# Patient Record
Sex: Female | Born: 1990 | Race: White | Hispanic: No | Marital: Single | Smoking: Light tobacco smoker
Health system: Southern US, Community
[De-identification: ages and names within clinical notes are randomized; demographics above are authoritative.]

## PROBLEM LIST (undated history)

## (undated) DIAGNOSIS — R112 Nausea with vomiting, unspecified: Secondary | ICD-10-CM

## (undated) DIAGNOSIS — Z9889 Other specified postprocedural states: Secondary | ICD-10-CM

## (undated) DIAGNOSIS — Z9289 Personal history of other medical treatment: Secondary | ICD-10-CM

## (undated) DIAGNOSIS — D649 Anemia, unspecified: Secondary | ICD-10-CM

## (undated) HISTORY — PX: ADENOIDECTOMY: SUR15

---
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
## 2011-02-11 NOTE — H&P (Signed)
[name], [name]               ACCOUNT NO.:  [id number]   MEDICAL RECORD NO.:  [id number]          PATIENT TYPE:  INP   LOCATION:  0105                          FACILITY:  [facility]   PHYSICIAN:  Lalla Brothers, MDDATE OF BIRTH:  1990/11/04   DATE OF ADMISSION:  04/27/2007  DATE OF DISCHARGE:                       PSYCHIATRIC ADMISSION ASSESSMENT   IDENTIFICATION:  This 20 year old female, who will enter the 11th grade  this fall at [employer], is admitted emergently voluntarily from  crisis presentation to access and intake at [facility]  with stepmother for inpatient stabilization and treatment of suicide  risk and depression.  The patient had written a suicide note that she  had attempted to overdose with alcohol, methamphetamine, and pills and  then cut herself on the thighs with the intent to die.  The patient is  feeling progressively distressed over separation from her lesbian  girlfriend of 1-2 years who happens to also be the sister of her  stepmother.  Father and likely stepmother have been discouraging this  relationship but the family is significantly ambivalent developmentally  at all levels past and present.  The patient is confused and wants to  die, hearing a single female voice telling her to cut herself.   HISTORY OF PRESENT ILLNESS:  The patient has had little mental health  treatment over time despite her significant trauma and losses as well as  her mental health symptoms.  She is currently taking Symbyax 6/50 mg as  1 every bedtime from Dr. Deatra James at [facility],  [phone].  The patient finds limited relief initially from the  medication which has not been sustained.  The medication does make her  significantly drowsy.  Her mood is not consistently improved and she is  still feeling depressed.  The medication does make her drowsy at night  but she is sluggish in the day and getting little done.  The patient has  been  using various drugs since age 32, feeling entitled to do so as  father is addicted to alcohol according to the patient and mother to  cocaine among other drugs.  The patient resides with father and  stepmother as biological mother abandoned the patient to continue her  drug use.  The patient therefore finds herself living in the same style  as parents whose lifestyles left the patient lost and relatively  traumatized.  The patient does not talk about her problems or talk out  particular solutions.  She therefore does not contract for safety or  accept the help of others.  She does call her stepmother mom  concluding that stepmother has been her only mom and possibly the only  consistent parental resource in her life.  Father indicates that he is  taking Xanax and wondering if that is making him gain weight though the  patient states that he continues to drink.  The patient's suicide note  seems to express that she is stressed being separated from her lesbian  partner who is the sister of the patient's stepmother.  The patient is  aware that she is  expected to separate from this relationship and is  also aware that the lesbian girlfriend is disapproving of the patient's  substance abuse, especially for alcohol.  The patient indicates that she  gets into fights easily.  She expresses little interest in change at the  time of admission from intake and crisis.  She just wants to feel better  about what she is doing.  However, by the morning after admission, the  patient indicates that she is despondent and in despair and tired of  feeling down and cutting herself.  However, it takes awhile for the  patient to either realize such or to become willing to discuss such.  The patient is receiving no other active treatment.  She smokes one pack  per day of cigarettes.  She has used alcohol since age 22 as well as  opiates and benzodiazepines such as Xanax and Klonopin.  She has used  crystal  methamphetamine and cannabis since age 38.  The patient does not  acknowledge any benefit from her substance abuse but does acknowledge  that she feels entitled to do so since her parents did the same.  The  patient does not describe dissociation.  She does not describe other  specific habits.  However, she is obviously somewhat overweight and has  impulse control difficulties.  She acknowledges that she had a lot of  difficulty in 2002, being frequently accident prone.  However, she does  not identify specifics in that regard.  She is significantly defiant and  does what she wants in most cases including getting in fights easily.   PAST MEDICAL HISTORY:  The patient was under the care of Celso Amy,  [place] at [facility] but now sees Dr. Deatra James.  The patient has lacerations and scars on both anterior thighs from self-  cutting with her self-cutting now reaching the suicidal proportion.  She  indicates heavy drinking, methamphetamine and other pills in the last  few days as though attempting to overdose and kill herself.  In 2002,  the patient had multiple injuries.  She was in [facility]  Emergency Department for a CT scan of the head in June of 2002, having a  cerebral concussion.  She had a Salter II fracture of the right distal  radius in August of 2002.  The patient was in the emergency department  after taking sleeping pills in October of 2002.  She had chicken pox in  1998.  She had an adenoidectomy in 1994 or 1995.  She states she has had  ventilation tubes for recurrent ear problems that contribute to  diminished hearing, particularly in the right ear.  She states she has  had a long history of impairment of visual acuity but the family has not  been able to afford, economically, eyeglasses or contacts for her.  Her  last menses was two weeks ago and she denies any chance of pregnancy  though she has now broken off her lesbian relationship with  stepmother's  sister.  The patient was last to the dentist two weeks ago.  She has no  medication allergies.  She has had no definite seizure or syncope.  She  has had no heart murmur or arrhythmia.   REVIEW OF SYSTEMS:  The patient denies difficulty with gait, gaze or  continence.  She denies exposure to communicable disease or toxins.  She  denies rash, jaundice or purpura.  She has no headache or sensory loss  currently.  There is no memory loss or coordination deficit.  There is  no cough, congestion, dyspnea, wheeze or tachypnea.  There is no chest  pain, palpitations, or presyncope.  There is no abdominal pain, nausea,  vomiting or diarrhea.  There is no dysuria or arthralgia.   IMMUNIZATIONS:  Up-to-date.   FAMILY HISTORY:  The patient lives with father and stepmother.  Father  has substance abuse with alcohol and takes Xanax regularly.  Father asks  if Xanax causes him to gain weight as he has been unable to lose but he  does not consider that alcohol may cause weight gain.  Biological mother  had crack addiction possibly among other drugs and chose drugs over the  family abandoning the family.  Stepmother has been the patient's mom.  There is extensive family history of substance abuse.   SOCIAL AND DEVELOPMENTAL HISTORY:  The patient has been dating the  sister's stepmother in a lesbian relationship for 1-2 years.  The  patient indicates that she attends the 11th grade this fall at [employer] which she states is on [employer] and she is Economist.  The patient denies any court charges at this time.  She uses  alcohol, cannabis, opiates, benzodiazepines like Xanax and Klonopin, and  methamphetamines starting at various ages between 70 and 23.  She does  not acknowledge other definite sexual activity other than her lesbian  relationship.   ASSETS:  The patient is intelligent.   MENTAL STATUS EXAM:  The patient's height is 62-1/2 inches and weight  is  200.5 pounds.  Blood pressure is 125/83 with heart rate of 99 (sitting)  and 129/85 with heart rate of 106 (standing).  She is right more than  left-handed but has some mixed cerebral dominance.  She is alert and  oriented with speech intact.  Cranial nerves 2-12 are intact.  Muscle  strengths and tone are normal.  AMRs are 0/0.  There are no pathologic  reflexes or soft neurologic findings.  There are no abnormal involuntary  movements.  Gait and gaze are intact.  The patient is outwardly sincere  about needing help with her depression while describing inward  entitlement to substance abuse and depressing lifestyle.  Object  relations confusion and identity diffusion and conflict are evident.  She has moderate to severe dysphoria and is insecure about her anger.  She has no florid psychosis or mania.  She reports a single female voice  as an auditory hallucination or illusion telling her to cut herself.  She seems to correlate this most with her father.  However, she does not  offer direct answers to such formulations and questions.  She does not  acknowledge post-traumatic flashbacks but has significant object loss  from mother.  She has suicidal ideation with a plan to cut herself to  die as her overdose with drugs and alcohol did not work.  She is not  homicidal but she does get in fights easily and can be assaultive.   IMPRESSION:  AXIS I:  Major depression, single episode, moderate to  severe.  Oppositional defiant disorder.  Psychoactive substance abuse  not otherwise specified.  Identity disorder with passive-aggressive  features.  Other interpersonal problem.  Parent-child problem.  Other  specified family circumstances.  Noncompliance with treatment.  AXIS II:  Diagnosis deferred.  AXIS III:  Lacerations both thighs, overweight, hearing and visual  impairment, cigarette smoking.  AXIS IV:  Stressors:  Family--severe to  extreme, acute and chronic;  phase of life--severe,  acute and chronic; peer relations--extreme, acute  and chronic.  AXIS V:  GAF on admission 32; highest in last year estimated at 60.   PLAN:  The patient is admitted for inpatient adolescent psychiatric and  multidisciplinary multimodal behavioral health treatment in a team-based  program at a locked psychiatric unit.  Will change her Cymbalta which  was held the first night of admission to Prozac 40 mg nightly and  Topamax 100 mg nightly.  The patient and father were educated on the  indications, side effects, risks and proper use of the medications.  Carbamazepine would be another alternative to Topamax should economics  be more important.  Cognitive behavioral therapy, anger management,  interpersonal therapy, identity differentiation and consolidation,  object relations therapy, individuation separation, grief and loss,  social and communication skill training, problem-solving and coping  skill training, and substance abuse intervention can be undertaken.   ESTIMATED LENGTH OF STAY:  Seven days with target symptoms for discharge  being stabilization of suicide risk and mood, stabilization of out-of-  control substance use and dangerous, disruptive behavior and  generalization of the capacity for safe, effective participation in  outpatient treatment.      Lalla Brothers, MD  Electronically Signed     GEJ/MEDQ  D:  04/28/2007  T:  04/29/2007  Job:  [id number]

## 2011-02-11 NOTE — H&P (Signed)
[name], [name]               ACCOUNT NO.:  [id number]   MEDICAL RECORD NO.:  [id number]          PATIENT TYPE:  INP   LOCATION:  0103                          FACILITY:  [facility]   PHYSICIAN:  Lalla Brothers, MDDATE OF BIRTH:  [birth year]   DATE OF ADMISSION:  05/20/2007  DATE OF DISCHARGE:                       PSYCHIATRIC ADMISSION ASSESSMENT   IDENTIFICATION:  This 100[age]1/20-year-old female, 11th grade student at the  [employer], is admitted emergently voluntarily from  crisis presentation to access and intake with father and stepmother for  inpatient stabilization and treatment of suicide plan to cut her wrist  or jump into traffic.  The patient reports being hopeless and depressed  again, unable to tolerate failure and a current relationship and having  an F in pre-nursing class at school.  The patient states only cutting  helps her pain and sense of loss and has multiple wounds on both  anterior thighs though she has not relapsed into substance abuse or as  much rule breaking.   HISTORY OF PRESENT ILLNESS:  The patient acknowledges that her greatest  stressor currently is the breakup in relationship with her lesbian  girlfriend.  The patient seems to indicate that this is her stepsister  again, with the patient being forbidden to date this stepsister at the  time of her last hospitalization at the [facility] April 27, 2007.  Father indicates to the patient that she keeps establishing  herself in a web of self-defeating relationships.  The patient was  becoming overwhelmed in the course of this relationship preceding her  last admission and now has apparently relapsed in that relationship in  some way.  The patient was considered to have a single episode of major  depression last hospitalization.  She had a longer standing pattern of  substance abuse with alcohol and cannabis dating back to age 50, if not  earlier.  However, she had an  overdose of sleeping pills in 2002 which  would be around age 44.  The patient reports two previous suicide  attempts apparently indicating the overdose and one other.  She was  admitted April 27, 2007 after writing a suicide note that she planned to  overdose again.  She is currently considering jumping into traffic or  cutting her wrist to die.  The patient has indicated that she does not  tolerate failure.  She suggests she is talking to her parents more since  her last hospitalization and has not relapsed in alcohol or cannabis.  The patient's grades were declining at the time of her last  hospitalization after making all A's in the past.  She is no longer  hearing the single female voice auditory hallucination telling her to cut  herself.  The patient was considered during her last hospitalization to  have early stages of alcohol and cannabis dependence, though also having  some methamphetamine, Xanax and Klonopin abuse episodically in the past.  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  At the same time, she has had  behavioral and substance abuse patterns that would likely interfere with  achieving these goals.  The patient has not acknowledged identification  with biological mother but her pattern of substance abuse and  relationship difficulties would certainly raise that concern.  The  patient indicates at the time of admission that she has gained weight  since last hospitalization though she is documented to have lost about  4.86-5.5 kg.  The patient has been on Topamax 200 mg nightly and  fluoxetine 40 mg nightly since her last hospitalization, being crossed  over from Symbyax 6/50 mg nightly that had been started by Dr. Wynelle Link prior  to the last hospitalization though dates were not fully clarified though  she was experiencing sedation from the  Symbyax.  The patient currently  states that the Topamax may be causing some decreased memory and some  tremor.  However, the differential for such symptoms could also include  substance use, undernutrition, or depressive agitation and confusion.  Patient is continuing cigarettes approximately half pack per day.  The  patient does not acknowledge other central nervous system insult though  in 2002 she had a cerebral concussion requiring CT scan of the head in  June of 2002, right wrist fracture, and the overdose with sleeping  pills.  The patient is not acknowledging other psychic trauma at this  time.  She reportedly does not have contact with biological mother.  She  is most supported by stepmother and apparently the family was not firm  with expecting relationship between the patient and stepsister to become  a nonromantic one, limited to just being stepsisters.  However, the  family seems to again expect such.  The patient and family did not  follow through with aftercare psychotherapy following the last hospital  discharge May 03, 2007.  They were scheduled to see Marni Griffon at  [facility] the [facility] May 11, 2007 but apparently did not  comply.  The patient was also to abstain from self-cutting but has not  done so, rather relapsing into progressive cutting.   PAST MEDICAL HISTORY:  The patient is under the primary care of Dr.  Evert Kohl at [facility].  The patient has a history  of chickenpox at age 30.  She had adenoidectomy apparently in 1996.  She  had ventilation tubes for diminished hearing, particularly in the right  ear.  She needed eyeglasses but the family has not been able to purchase  such financially.  The patient had a right wrist fracture in 2002 as  well as an overdose on sleeping pills.  In June of 2002, she had a  cerebral concussion and CT scan of the head was negative.  At the time  of the last admission, the patient was  using Afrin nose spray and having  nose bleeds.  During her last hospitalization, she was discontinued from  Afrin and was treated with Bactroban nasal ointment with resolution of  nosebleeds and she has apparently not restarted Afrin.  During her last  hospitalization, her total cholesterol was elevated at 122 with upper  limit of normal 169 and LDL cholesterol was elevated at 147 with upper  limit of normal 109.  HDL cholesterol was normal at that time.  The  patient has complained of some right low back pain recently which  stepmother suspects might be a urinary infection.  The patient is on  Topamax which can predispose  to urolithiasis but she is not definitely  having colic although she rates her back pain high as in 8/10.  However,  the patient smiles as she is doing so, suggesting an incongruence as  though overstating her symptoms.  The patient denies that her acne is  any worse from fluoxetine.  Last dental exam was July of 2008 before her  last hospitalization.  At the time of current admission, she is taking  Topamax 200 mg nightly and fluoxetine 40 mg nightly and does not  acknowledge using Bactroban currently.  She has no medication allergies.  She does not acknowledge any history of seizure or syncope.  She has had  no heart murmur or arrhythmia.   REVIEW OF SYSTEMS:  The patient denies difficulty with gait, gaze or  continence.  She denies exposure to communicable disease or toxin  otherwise.  She denies rash, jaundice or purpura currently.  She has no  headache or sensory loss.  However, she does report some clouding of her  memory and some tremor without any loss of coordination.  She has no  cough, congestion, chest pain, palpitations, presyncope, dyspnea or  wheeze.  There is no abdominal pain, nausea, vomiting or diarrhea.  There is no dysuria or arthralgia though she does have the right low  back pain.   IMMUNIZATIONS:  Up-to-date.   FAMILY HISTORY:  There is a  strong family history of substance abuse  though they are not more specific.  Father has had alcohol abuse and  currently takes Xanax for anxiety.  The patient's biological mother had  addiction to crack cocaine and antisocial behavior, abandoning the  family.  The patient has lived with stepmother for the last five years  and reports having sisters, ages 70 and 6.  The patient seems  relationally closest to stepmother though romantic with stepsister and  she reported an auditory hallucination that was masculine, not  definitely her father during her last hospitalization, telling her to  cut herself.   SOCIAL AND DEVELOPMENTAL HISTORY:  The patient is an 11th grade student  at the [employer] apparently a [employer] associated with  [employer].  She indicates she is currently failing pre-nursing.  Her grades have declined at the end of the last school year and she  usually had A's in the past prior to that.  She wants to be a Tree surgeon.  She has used alcohol and cannabis in the past, progressive since  age 26, and she was concluded to have early stages of alcohol and  cannabis dependence during her last admission.  She has not used alcohol  or cannabis since that last admission.  She has also used  methamphetamine, Xanax and Klonopin in the past.  The patient denies  legal charges.  She denies heterosexual intercourse but does acknowledge  a lesbian relationship with her current stepsister.  She does smoke  cigarettes apparently half-pack per day.   ASSETS:  The patient is intelligent.   MENTAL STATUS EXAM:  Height is 63 inches, up from 62.5 inches last  admission or 160 cm.  Weight is 87 kg, down from 92.5 kg at discharge  and 91.14 kg on admission, April 27, 2007 through May 03, 2007.  Blood  pressure is 106/72 with heart rate of 80 (sitting) and 121/76 with heart  rate of 92 (standing).  She is right more than left-handed but has mixed  cerebral dominance.   Cranial nerves 2-12 are intact.  Muscle strengths  and tone are normal.  There are no pathologic reflexes or soft  neurologic findings at this time.  There is no abnormal involuntary  movement though the patient describes some episodic tremor that she has  attributed to her Topamax.  The patient has cognitive dissonance and  psychomotor slowing.  Memory seems intact for recent and remote as well  as immediate content though she is reporting that her mind is clumsy and  slowed by self-report.  The patient is observed to be somewhat distant  and mechanical when being specifically questioned though otherwise she  has reflexive spontaneity and affect and thought.  The patient may be  gaining some further insight and access into the origin and course of  her identifications and social and emotional postures.  The patient  seems overwhelmed in that process currently.  She does not seem to have  resolved the loss of biological mother and the insults from the  biological mother.  Father describes a web of maladaptive connections in  her family relations.  The patient does not manifest significant anxiety  but rather cognitive dissonance almost approaching dissociation  initially.  She does not report hallucinations at this time such as an  auditory masculine voice telling her to cut herself as occurred at the  last hospitalization.  Risk-taking seems to be exacerbating again with  self-cutting and relational risk-taking being the first to decompensate.  She has no definite hallucinations at this time and no definite  delusions.  She has suicidal ideation and plan.  She seems to have habit  formation and impulse control difficulties.  She has suicide plan to cut  her wrist or jump into traffic to die, though she has been cutting both  anterior thighs again to dissipate strong negative emotion and to  establish control over suicide impulses.  She denies homicidal ideation  or assaultiveness.    IMPRESSION:  AXIS I:  Major depression, single episode, severe with  atypical features.  Oppositional defiant disorder.  Alcohol dependence,  early stage.  Cannabis dependence, early stage.  Other specified family  circumstances.  Parent-child problem.  Other interpersonal problem.  Noncompliance with psychotherapy.  AXIS II:  Diagnosis deferred.  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  AXIS IV:  Stressors:  Family--severe to extreme, acute and chronic;  school--mild to moderate, acute and chronic; phase of life--severe,  acute and chronic.  AXIS V:  GAF on admission 36; highest in last year estimated at 60.   PLAN:  The patient is admitted for inpatient adolescent psychiatric and  multidisciplinary multimodal behavioral health treatment in a team-based  programmatic locked psychiatric unit.  Will increase fluoxetine to 60 mg  nightly and decrease Topamax to 100 mg nightly initially while awaiting  laboratory metabolic screens.  Multivitamin and Neosporin topically will  be planned along with behavioral assessment of eating competency.  Drug  abuse survey is pending.  Urinalysis is pending relative to low back  pain and urine culture will be obtained as well.  Cognitive behavioral  therapy, anger management, interpersonal therapy, identity  consolidation, substance abuse relapse prevention, habit reversal,  family therapy, social and communication skill training, and problem-  solving and coping skill training can be undertaken  as therapies.   ESTIMATED LENGTH OF STAY:  Five to seven days with target symptoms for  discharge being stabilization of suicide risk and self-injury,  stabilization of mood and  dangerous, disruptive behavior, and  generalization of the capacity for safe, effective participation in  outpatient treatment.      Lalla Brothers, MD  Electronically Signed     GEJ/MEDQ  D:  05/20/2007  T:  05/21/2007  Job:  [id number]

## 2011-02-14 NOTE — Discharge Summary (Signed)
[name], [name]               ACCOUNT NO.:  [id number]   MEDICAL RECORD NO.:  [id number]          PATIENT TYPE:  INP   LOCATION:  0105                          FACILITY:  [facility]   PHYSICIAN:  [name], MDDATE OF BIRTH:  11-Feb-1991   DATE OF ADMISSION:  04/27/2007  DATE OF DISCHARGE:  05/03/2007                               DISCHARGE SUMMARY   IDENTIFICATION:  20-year-old female entering Lisa 11th grade this  fall at [name], associated with [name], was  admitted emergently voluntarily when brought by parents to [name] at [name] for suicide note  documenting suicide attempt by overdose, as well as self-cutting,  continued suicidality.  Lisa patient is most distressed by separation  from her girlfriend of 2 years duration, who is Lisa sister of Lisa  patient's stepmother, and now forbidden to be Lisa patient's girlfriend.  Lisa patient reports a single female voice, auditory hallucination telling  her to cut herself, and states she is confused but wants to die.  For  full details, please see Lisa typed admission assessment.   SYNOPSIS OF PRESENT ILLNESS:  Lisa patient has started Symbyax 6/50 every  bedtime from Dr. Deatra Mcmillan at [name], which  helps her sleep, though she has not yet improved her mood and behavior.  Lisa patient described significant substance abuse including with alcohol  since age 20, cannabis since age 20, opiates and benzodiazepines since  age 20, and methamphetamine since age 20, though her urine drug screen  on admission is negative.  She has crisscross razor lacerations on both  anterior thighs, though Lisa patient states that cutting does not seem to  help anymore.  Her suicide note documents wanting to end it all.  Lisa  patient documents significant substance abuse in Lisa family, with mother  having crack addiction among other drugs and abandoning Lisa family  for  her drugs.  Lisa patient considers stepmother to be her mom, though she  is concerned about father's substance abuse with alcohol, and father  also takes Xanax.  Lisa patient wants to be a Leisure centre manager.  Stepmother  has lived with Lisa patient for 5 years.  Lisa patient has no relation  with biological mother now.  Lisa patient has sisters ages 25 and 16.  She had been a [employer], but her grades have dropped.   INITIAL MENTAL STATUS EXAM:  Lisa patient maintains inward entitlement to  substance abuse and depressing lifestyle, while outwardly manifesting  sincerity about treatment need.  However, she seems ambivalent about  investment in treatment.  She is right more than left-handed, but has  mixed cerebral dominance.  She manifests no or organicity.  She has  moderate to severe dysphoria with Lisa single auditory hallucination of a  female voice telling her to cut.  She has identity diffusion in object  relations confusion.  She has significant object loss relative to  mother, but denies post-traumatic flashbacks.  She fights easily but is  not homicidal.   LABORATORY FINDINGS:  CBC  on admission is normal with 2% basophils, with  upper limit of normal 1% on differential, with absolute basophil count  200, with upper limit of normal 100.  Total white count was normal at  9900, hemoglobin 14.8, MCV of 88 and platelet count 260,000.  Comprehensive metabolic panel on admission is normal with sodium 139,  potassium 3.6, fasting glucose 89, creatinine 0.71, calcium 9.9, albumin  4.1, AST 19 and ALT 15 with GGT 23.  Ten-hour fasting lipid panel  revealed total cholesterol elevated at 222 with upper limit of normal  169, LDL cholesterol 147 with upper limit of normal 109, but HDL  cholesterol normal at 44 mg/dL.  Ten-hour fasting triglyceride was 153  with 14-hour fasting normal being less than 150.  Hemoglobin A1c was  normal at 5.4% with reference range 4.6 to 6.1.  Free T4 was  normal at  1.06 and TSH at 2.245.  A final comprehensive metabolic panel on Lisa day  before discharge was normal with fasting glucose 93, creatinine 0.8,  and calcium 9.7.  Lisa patient's chloride before Topamax 200 mg nightly  was 102 with reference range 96-112, and on Lisa day before discharge on  Topamax, Lisa chloride was 107.  Before Topamax, her initial CO2 was 25  with reference range 19-32, and on Lisa day before discharge, CO2 was  normal at 26.  Urine probe for gonorrhea and Chlamydia trachomatis by  DNA amplification were both negative.  RPR was nonreactive.  Urine  pregnancy test was negative.  Urinalysis was normal except concentrated  specimen with specific gravity 1.035, pH 5.5, small amount of bilirubin.  Urine drug screen was negative with creatinine of 292 mg/dL, documenting  adequate specimen.   HOSPITAL COURSE AND TREATMENT:  Old record did document a normal CT of  Lisa head without contrast from June 2002 when Lisa patient hit her head  with syncope.  General medical exam by Lisa Guild, PA-C noted  adenoidectomy in 1994 and right wrist fracture at age 20.  Lacerations  were currently not infected and were treated with Neosporin, while  nosebleeds and potentially habitual use of Afrin were treated with  Bactroban nasal.  BMI was 36.1.  She has acne vulgaris.  She she is  never been sexually active, according to Lisa patient, but is homosexual.  Vital signs were normal throughout hospital stay with height on  admission being 62-1/2 inches and weight was 91.14 kg.  Discharge weight  was 92.5 kg.  Supine blood pressure initially was 122/86 with heart rate  of 84, and standing blood pressure 154/99 with heart rate of 99.  Diastolic blood pressure was elevated Lisa first 3 hospital days with  blood pressure otherwise normal.  On Lisa final 3 hospital days, Lisa  patient's blood pressures were normal including on Lisa day of discharge  Lisa supine blood pressure being 115/71 with heart  rate of 91 supine, and  143/67 with heart rate of 91 standing.  Lisa patient was discontinued  from Symbyax.  She had nutrition consult addressing her fast-food  fixation and excess, and ways to reduce cholesterol and contain weight.  Skin hygiene was stressed relative to acne, and Lisa patient's use of 1  pack per day of cigarettes prior to admission necessitated availability  of Nicoderm 21-mg patch, though Lisa patient used this infrequently.  She  would also decline her Bactroban nasal at times, but was consistent with  her Neosporin for her thighs.  Lisa patient was started on Topamax,  titrated up to 200 mg nightly, and fluoxetine 40 mg nightly, as Symbyax  was discontinued for elevated cholesterol and weight.  Lisa patient was  not troubled by auditory hallucinations during Lisa hospital stay.  Substance abuse assessment by Cleophas Dunker concluded alcohol dependence,  early stage, and cannabis dependence, early stage.  Family therapy was  most important including with biological father and stepmother.  Biological father visited frequently after Lisa first family therapy  session.  Stepmother was present for discharge.  Lisa patient was  apologetic in her letters to father and stepmother, and they discussed  communication and relations.  Lisa patient wanted to go to [employer]  immediately after discharge, despite nutrition consultation addressing  discontinuation of fast food.  Lisa patient, thereby, maintain some  oppositionality, but was more sincere and communicative with Lisa family.  She required no seclusion or restraint during hospital stay.   FINAL DIAGNOSES:  AXIS I:  1. Major depression, single episode, severe.  2. Oppositional defiant disorder.  3. Alcohol dependence, early stage.  4. Cannabis dependence, early stage.  5. Parent-child problem.  6. Other interpersonal problem.  7. Other specified family circumstances.  8. Noncompliance with treatment.  AXIS II:  Diagnosis  deferred.  AXIS III:  1. Self-inflicted lacerations, both thighs.  2. Overweight with elevated LDL cholesterol.  3. Acne.  4. Cigarette-smoking.  5. Hearing and visual impairment by history.  6. Afrin habituation with nosebleeds.  AXIS IV:  Stressors family severe to extreme, acute and chronic; phase  of life severe, acute and chronic; peer relations extreme, acute and  chronic.  AXIS V:  Global assessment of functioning on admission 32 with highest  in last year 60, and discharge global assessment of functioning was 53.   PLAN:  Lisa patient was discharged to stepmother in improved condition,  free of suicide and homicide ideation.  She follows Lisa weight and  cholesterol-controlled diet as per nutrition consultation April 29, 2007.  She has no restrictions on physical activity but is rather encouraged be  physically active.  Her Bactroban may be utilized for both thigh-  scarring minimization as well as another week, at least b.i.d., to both  nostrils for history of nosebleeds.  M[facility]illan and safety plans are  outlined if needed.  She is prescribed fluoxetine 40 mg every bedtime,  quantity #30 with 1 refill written.  She is also prescribed Topamax 200  mg every bedtime, quantity #30 with 1 refill written.  She is educated  on side effects, risks, and proper use of medication including FDA  guidelines and instructions.  She is having no side effects at Lisa time  of discharge, including no suicide-related side effects.  She will have  aftercare at [facility], having an appointment with  Marni Griffon May 11, 2007, at noon, and psychiatric followup can be  arranged from that intake, though she has in Lisa past seen Dr. Deatra Mcmillan also at [facility] at [name] for pharmacotherapy as  potential followup.      Lisa Brothers, MD  Electronically Signed     GEJ/MEDQ  D:  05/04/2007  T:  05/04/2007  Job:  [id number]

## 2011-02-14 NOTE — Discharge Summary (Signed)
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  The patient again  experiences a sense of failure in her relationship with a sister of her  stepmother that father wants to end.  The patient is cutting herself  again to help her pain and sense of loss and to contain suicide  impulses, having multiple wounds on both anterior thighs.  She has not  relapsed into substance abuse since last discharge and is less rule-  breaking.  The patient seems to seek some nurturing resolution of her  self-destructiveness at the [facility] almost as a  displacement of that forbidden relationship with stepmother's sister.  For full details, please see the typed admission assessment.   SYNOPSIS OF PRESENT ILLNESS:  Amazing was treated inpatient April 27, 2007  through May 03, 2007 for similar symptoms but did not follow through  with aftercare.  She did not attend her appointment with [facility]  [facility] the [place] for psychotherapy and dual diagnosis treatment.  At the  time of her last hospitalization, the patient may have overstated her  alcohol and cannabis use so that her substance abuse assessment at that  time concluded early stage alcohol and cannabis dependence.  The patient  has  some paradoxical identification with biological mother who has crack  addiction among other drugs and who abandoned the patient and family for  continuing drugs.  This patient now considers stepmother as mom though  she is concerned about father's substance use with alcohol.  Father also  takes Xanax for anxiety and dysphoria.  The patient had an overdose with  sleeping pills in 2002 around age 11 without apparently receiving other  mental health treatment and she reported a masculine auditory  hallucination at the time of her last admission instructing her to cut  herself but that is now resolved.  She has been known to abuse,  methamphetamine, Xanax and Klonopin episodically in the past as well.  She became overly sleepy and is overweight such that Symbyax 6/50 mg  nightly started before her last hospitalization was not the optimal  treatment and she was treated with Topamax 200 mg nightly as well as  fluoxetine 40 mg nightly.  However, she has had some diminished memory  functioning and some tremor on 200 mg of Topamax nightly, though without  other side effects.  She had a CT scan  of the head in June of 2002 for a  cerebral concussion that was negative.  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  She needs eyeglasses but the family has not been able to purchase  these financially.  She was habitually using Afrin nose spray with nose  bleeds during her last hospitalization and was treated with Bactroban  nasal ointment and such was resolved.  She currently has right low back  pain that stepmother suspects to be urinary infection but not  urolithiasis which the stepmother has experienced in the past.  The  patient has significant abdominal overweight stature.  The patient  smokes a half-pack per day of cigarettes.   INITIAL MENTAL STATUS EXAM:  The patient reports her mind to be clumsy  and slowed by self-report but she  is not objectively noted to have any  neurological abnormalities with neurological exam intact.  She was  somewhat distant and mechanical initially on presentation but quickly  engaged in treatment this time.  She has no auditory hallucinations at  this time.  She does have severe dysphoria.  She is somewhat habit  ridden with impulse control difficulties.  She has atypical depressive  features with overeating and easy anger.  She had suicide plan to cut  her wrist or jump into traffic.   LABORATORY FINDINGS:  During the patient's last hospitalization, fasting  glucose was normal at 89, sodium 139, potassium 3.6, calcium 9.9,  albumin 4.1, AST 19, ALT 15 and GGT 23 on April 28, 2007.  10-hour  fasting lipid panel revealed total cholesterol elevated at 222 with  upper limit of normal 169, LDL cholesterol elevated at 147 with upper  limit of normal 109 and HDL cholesterol normal at 44 with triglyceride  153.  Hemoglobin A1C was normal at 5.4% with reference range 4.6-6.1.  Free T4 was normal at 1.06 and TSH at 2.245.  Her final chloride on  May 02, 2007 on 200 mg of Topamax nightly was 107, having been 102  before Topamax was started with reference range 96-112.  Her CO2 on  May 02, 2007 was 26, having been 25 prior to Topamax with reference  range 19-32.  Urine drug screen was negative at the time of last  hospitalization.  During this hospitalization, basic metabolic panel was  normal except potassium 3.4 with lower limit of normal 3.5.  Sodium was  normal at 137, random glucose 86, chloride 107, CO2 24, creatinine 0.8,  calcium 9.1.  Hepatic function panel was normal with albumin 4, AST 20,  ALT 17, GGT 27, and total bilirubin 0.5.  CBC was normal except white  count slightly elevated at 10,300 with upper limit of normal 10,000 and  platelet count slightly elevated at 329,000 with upper limit of normal  325,000.  Urine pregnancy test was negative.  Urinalysis was normal  except for a  trace of ketones, trace of occult blood, small amount of  bilirubin and specific gravity upper limit of normal at 1.030 with many  epithelial and some amorphous urate crystals on microscopic exam.  Urine  culture was 15,000 colonies per milliliter of multiple bacterial  morphotypes with no predominant pathogens suggesting poor clean catch.  Urine drug screen was negative with creatinine of 304 mg/dL.   HOSPITAL COURSE AND TREATMENT:  General medical exam by Mallie Darting PA-  C noted frequent stomachaches and the right low back pain.  The patient  reported fainting after the breakup with her girlfriend but stated she  stopped eating as the cause.  She had old and new self-inflicted  lacerations and has obesity.  The patient received several doses of  acetaminophen 1000 mg during the hospital stay but required no other  analgesics and had no objective interference in the treatment program by  low back pain.  The abdominal obesity may well be contributing to the  source of the right low back pain and no other pathological abnormality  was found though the patient may wish follow-up with Dr. Deatra James in  this regard.  The patient was afebrile throughout the hospital stay.  She had education on a weight and cholesterol control diet by nutrition  April 29, 2007 during her last hospitalization.  Wound care was provided  with Neosporin and she had no further self-injury during the hospital  stay.  Noncompliance with aftercare last admission was addressed with  father and stepmother as well as the patient including in the final  family therapy session on the day of discharge.  Father indicated during  this family session that grandparents had called DSS in the past and  threatened to do so again, considering the father neglectful.  This  allowed father to address compliance necessary for the patient's  aftercare.  Father feels the patient is attention-seeking though the  patient talks normally  with stepmother.  The patient shuts down when  angry, especially with father.  Family has established interventions to  prevent the patient's contact with the stepmother's sister in the  future.  The patient herself concluded she wants to stay away from the  stepmother's sister of her own accord now and acknowledges that family  prohibition did not work.  The patient became motivated to return to  school and to be involved with sports or get a job.  The patient's  Topamax was reduced to 100 mg every bedtime and her tremor and memory  difficulties resolved.  Her fluoxetine was advanced to 60 mg every  bedtime and she tolerated this well.  She required no seclusion or  restraint during the hospital stay.  Her self-injurious behavior and her  suicidal ideation resolved.  She required no seclusion or restraint  during the hospital stay.   FINAL DIAGNOSES:  AXIS I:  Major depression, single episode, severe with  atypical features.  Oppositional defiant disorder.  Alcohol abuse.  Cannabis abuse.  Other specified family circumstances.  Other  interpersonal problem.  Parent-child problem.  Noncompliance with  treatment.  AXIS II:  Diagnosis deferred.  AXIS III:  Right low back pain, likely mechanical, possibility  associated with abdominal obesity, elevated total and LDL cholesterol,  self-inflicted lacerations both anterior thighs, diminished hearing  chronically right ear with history of ventilation tubes, needs  eyeglasses for impaired visual acuity, history of fainting after breakup  with girlfriend when not eating.  AXIS IV:  Stressors:  Family--severe to extreme, acute and chronic;  school--mild to moderate, acute and chronic; phase of life--severe,  acute and chronic; peer relations--severe, acute and chronic.  AXIS V:  GAF on admission 36; highest in last year estimated at 60;  discharge GAF 55.   CONDITION ON DISCHARGE:  The patient's weight had been 92.5 kg on May 01, 2007  during her last hospitalization.  Her admission weight on May 20, 2007 this time was 87 kg with subsequent weight May 22, 2007 of  88.75 kg.  Her height was 160 cm.  Her blood pressure at the time of  discharge is 123/76 with heart rate of 77 (supine) and 135/89 with heart  rate of 98 (standing).   ACTIVITY/DIET:  The patient is discharged on the weight and cholesterol  control diet educated by nutrition April 29, 2007.  She has no  restrictions on physical activity.  She is to abstain from any self-  cutting and can use Neosporin for wound and scar care.  Crisis and  safety plans are outlined if needed.  She requires no pain management at  this time though she has taken several doses of acetaminophen during her  hospital stay.  She is prescribed the following medication at discharge.   DISCHARGE MEDICATIONS:  1. Fluoxetine 20 mg capsule, to take 3 every bedtime; quantity #90      with one refill prescribed.  2. Topamax 100 mg every bedtime; quantity #30 with one refill      prescribed.   FOLLOWUP:  The patient will have ongoing medical follow-up and care with  Dr. Deatra James as scheduled by the family.  The patient will have  outpatient mental health care intake for dual diagnosis care at [facility] [place] with Marni Griffon at [phone], extension [phone]  with appointment scheduled on June 01, 2007 at 12 noon.  Psychiatric  appointment can be scheduled from that intake with Marni Griffon.      Lalla Brothers, MD  Electronically Signed     GEJ/MEDQ  D:  05/26/2007  T:  05/27/2007  Job:  [id number]   cc:   Marni Griffon  [facility]  [address]  [place], [place]  fax [phone]   Dr. Ermel[facility]  [address] [place], [place] [address]

## 2011-07-11 LAB — URINALYSIS, ROUTINE W REFLEX MICROSCOPIC
Glucose, UA: NEGATIVE
Leukocytes, UA: NEGATIVE
Nitrite: NEGATIVE
Protein, ur: NEGATIVE
Specific Gravity, Urine: 1.03
Urobilinogen, UA: 1
pH: 6

## 2011-07-11 LAB — URINE CULTURE
Colony Count: 15000
Special Requests: NEGATIVE

## 2011-07-11 LAB — CBC
HCT: 39.9
Hemoglobin: 13.7
MCHC: 34.3
MCV: 89.3
RBC: 4.47
WBC: 10.3 — ABNORMAL HIGH

## 2011-07-11 LAB — HEPATIC FUNCTION PANEL
ALT: 17
AST: 20
Alkaline Phosphatase: 62
Bilirubin, Direct: <0.1
Total Bilirubin: 0.5

## 2011-07-11 LAB — URINE MICROSCOPIC-ADD ON

## 2011-07-11 LAB — PREGNANCY, URINE: Preg Test, Ur: NEGATIVE

## 2011-07-11 LAB — BASIC METABOLIC PANEL
CO2: 24
Chloride: 107
Potassium: 3.4 — ABNORMAL LOW

## 2011-07-11 LAB — DRUGS OF ABUSE SCREEN W/O ALC, ROUTINE URINE
Amphetamine Screen, Ur: NEGATIVE
Barbiturate Quant, Ur: NEGATIVE
Benzodiazepines.: NEGATIVE
Cocaine Metabolites: NEGATIVE
Creatinine,U: 303.9
Marijuana Metabolite: NEGATIVE
Methadone: NEGATIVE
Opiate Screen, Urine: NEGATIVE
Phencyclidine (PCP): NEGATIVE
Propoxyphene: NEGATIVE

## 2011-07-14 LAB — LIPID PANEL
HDL: 44
Total CHOL/HDL Ratio: 5

## 2011-07-14 LAB — COMPREHENSIVE METABOLIC PANEL
ALT: 15
AST: 19
Alkaline Phosphatase: 55
BUN: 12
CO2: 25
Chloride: 102
Creatinine, Ser: 0.8
Creatinine, Ser: 0.71
Glucose, Bld: 93
Potassium: 3.9
Sodium: 139
Total Bilirubin: 1
Total Protein: 6.7

## 2011-07-14 LAB — T4, FREE: Free T4: 1.06

## 2011-07-14 LAB — TSH: TSH: 2.245

## 2011-07-14 LAB — URINALYSIS, ROUTINE W REFLEX MICROSCOPIC
Ketones, ur: NEGATIVE
Nitrite: NEGATIVE
pH: 5.5

## 2011-07-14 LAB — CBC
Hemoglobin: 14.8
MCV: 88.2
RBC: 4.9
WBC: 9.9

## 2011-07-14 LAB — DRUGS OF ABUSE SCREEN W/O ALC, ROUTINE URINE
Cocaine Metabolites: NEGATIVE
Phencyclidine (PCP): NEGATIVE
Propoxyphene: NEGATIVE

## 2011-07-14 LAB — RPR: RPR Ser Ql: NONREACTIVE

## 2011-07-14 LAB — DIFFERENTIAL
Basophils Absolute: 0.2 — ABNORMAL HIGH
Eosinophils Absolute: 0.5
Eosinophils Relative: 5

## 2011-07-14 LAB — PREGNANCY, URINE: Preg Test, Ur: NEGATIVE

## 2012-01-27 ENCOUNTER — Emergency Department (HOSPITAL_COMMUNITY): Payer: Self-pay

## 2012-01-27 ENCOUNTER — Emergency Department (HOSPITAL_COMMUNITY)
Admission: EM | Admit: 2012-01-27 | Discharge: 2012-01-27 | Disposition: A | Discharge status: Home / Self Care | Payer: Self-pay | Attending: Emergency Medicine | Admitting: Emergency Medicine

## 2012-01-27 ENCOUNTER — Encounter (HOSPITAL_COMMUNITY): Payer: Self-pay | Admitting: *Deleted

## 2012-01-27 DIAGNOSIS — J40 Bronchitis, not specified as acute or chronic: Secondary | ICD-10-CM | POA: Insufficient documentation

## 2012-01-27 DIAGNOSIS — J45909 Unspecified asthma, uncomplicated: Secondary | ICD-10-CM | POA: Insufficient documentation

## 2012-01-27 MED ORDER — PREDNISONE 20 MG PO TABS
60.0000 mg | ORAL_TABLET | Freq: Once | ORAL | Status: AC
  Administered 2012-01-27: 60 mg via ORAL
  Filled 2012-01-27: qty 3

## 2012-01-27 MED ORDER — PREDNISONE (PAK) 10 MG PO TABS: 10.0000 mg | ORAL_TABLET | Freq: Every day | ORAL | Status: AC

## 2012-01-27 MED ORDER — AZITHROMYCIN 250 MG PO TABS: 250.0000 mg | ORAL_TABLET | Freq: Every day | ORAL | Status: AC

## 2012-01-27 MED ORDER — ALBUTEROL SULFATE HFA 108 (90 BASE) MCG/ACT IN AERS
2.0000 | INHALATION_SPRAY | RESPIRATORY_TRACT | Status: DC | PRN
  Administered 2012-01-27: 2 via RESPIRATORY_TRACT
  Filled 2012-01-27: qty 6.7

## 2012-01-27 MED ORDER — HYDROCOD POLST-CHLORPHEN POLST 10-8 MG/5ML PO LQCR: 5.0000 mL | Freq: Two times a day (BID) | ORAL | Status: DC | PRN

## 2012-01-27 NOTE — ED Provider Notes (Signed)
History     CSN: [id number]  Arrival date & time 01/27/12  1400   First MD Initiated Contact with Patient 01/27/12 1650      Chief Complaint  Patient presents with  . Cough    (Consider location/radiation/quality/duration/timing/severity/associated sxs/prior treatment) HPI  21 year old female with history of asthma presents with cough and congestion for the past 2 days. Patient states since yesterday she has been having a persistent cough. Cough is productive with yellow sputum. She has some posttussive emesis. Patient has nasal congestion, and chest congestion. She denies fever, ear pain, sore throat, chest pain, headache, nausea, vomiting, diarrhea, abdominal pain, back pain, or rash. Patient is a smoker and smoked half a pack a day. Patient also complained of significant pollen outside. However she denies itchy eyes, sneezing, or runny nose. Patient has tried Robitussin without relief. Patient has a history of asthma but denies prior intubations or hospitalization due to asthma.  Past Medical History  Diagnosis Date  . Asthma     History reviewed. No pertinent past surgical history.  History reviewed. No pertinent family history.  History  Substance Use Topics  . Smoking status: Current Everyday Smoker  . Smokeless tobacco: Not on file  . Alcohol Use: No    OB History    Grav Para Term Preterm Abortions TAB SAB Ect Mult Living                  Review of Systems  All other systems reviewed and are negative.    Allergies  Review of patient's allergies indicates no known allergies.  Home Medications   Current Outpatient Rx  Name Route Sig Dispense Refill  . GUAIFENESIN 100 MG/5ML PO LIQD Oral Take 200 mg by mouth 3 (three) times daily as needed. congestion    . OVER THE COUNTER MEDICATION Oral Take 1 tablet by mouth daily.      BP 142/93  Pulse 106  Temp(Src) 98.3 F (36.8 C) (Oral)  Resp 20  SpO2 97%  LMP 01/19/2012  Physical Exam  Nursing note and  vitals reviewed. Constitutional: She appears well-developed and well-nourished. No distress.       Awake, alert, nontoxic appearance  HENT:  Head: Normocephalic and atraumatic.  Right Ear: External ear normal.  Left Ear: External ear normal.  Mouth/Throat: Oropharynx is clear and moist. No oropharyngeal exudate.  Eyes: Conjunctivae are normal. Right eye exhibits no discharge. Left eye exhibits no discharge.  Neck: Neck supple.  Cardiovascular: Normal rate and regular rhythm.   Pulmonary/Chest: Effort normal. No respiratory distress. She has wheezes. She has no rales. She exhibits no tenderness.  Abdominal: Soft. There is no tenderness. There is no rebound.  Musculoskeletal: She exhibits no tenderness.       ROM appears intact, no obvious focal weakness  Neurological: She is alert.       Mental status and motor strength appears intact  Skin: Skin is warm. No rash noted.  Psychiatric: She has a normal mood and affect.    ED Course  Procedures (including critical care time)  Labs Reviewed - No data to display Dg Chest 2 View  01/27/2012  *RADIOLOGY REPORT*  Clinical Data: Cough, congestion and history of smoking.  CHEST - 2 VIEW  Comparison: No priors.  Findings: Lung volumes are normal.  No consolidative airspace disease.  No pleural effusions.  No pneumothorax.  No pulmonary nodule or mass noted.  Pulmonary vasculature and the cardiomediastinal silhouette are within normal limits.  IMPRESSION: 1. No  radiographic evidence of acute cardiopulmonary disease.  Original Report Authenticated By: Florencia Reasons, M.D.     No diagnosis found.    MDM  Patient with persistent cough, and chest congestion. Chest x-ray shows no acute finding. Patient is afebrile. However, due to the smoking history patient will be prescribed Z-Pak. Inhaler given in the ED. Will discharge with cough medication, and steroid taper course. Smoking cessation discussed.        Fayrene Helper, PA-C 01/27/12  1708

## 2012-01-27 NOTE — ED Notes (Signed)
Pt in c/o cough and congestion x2 days 

## 2012-01-27 NOTE — Discharge Instructions (Signed)

## 2012-01-27 NOTE — ED Notes (Signed)
Pt. Started coughing two days ago with yellowish sputum, now she reports her sputum is pinkish.  She is having body aches and headache.  Denies sore throat.  She is also having cold chills and nasal congestion.

## 2012-01-28 NOTE — ED Provider Notes (Signed)
Medical screening examination/treatment/procedure(s) were performed by non-physician practitioner and as supervising physician I was immediately available for consultation/collaboration.  Toy Baker, MD 01/28/12 [phone]

## 2013-02-23 ENCOUNTER — Emergency Department (HOSPITAL_COMMUNITY)
Admission: EM | Admit: 2013-02-23 | Discharge: 2013-02-23 | Disposition: A | Discharge status: Home / Self Care | Payer: Self-pay | Attending: Emergency Medicine | Admitting: Emergency Medicine

## 2013-02-23 ENCOUNTER — Emergency Department (HOSPITAL_COMMUNITY): Payer: Self-pay

## 2013-02-23 ENCOUNTER — Encounter (HOSPITAL_COMMUNITY): Payer: Self-pay | Admitting: Emergency Medicine

## 2013-02-23 DIAGNOSIS — J45909 Unspecified asthma, uncomplicated: Secondary | ICD-10-CM | POA: Insufficient documentation

## 2013-02-23 DIAGNOSIS — N949 Unspecified condition associated with female genital organs and menstrual cycle: Secondary | ICD-10-CM | POA: Insufficient documentation

## 2013-02-23 DIAGNOSIS — F17200 Nicotine dependence, unspecified, uncomplicated: Secondary | ICD-10-CM | POA: Insufficient documentation

## 2013-02-23 DIAGNOSIS — N920 Excessive and frequent menstruation with regular cycle: Secondary | ICD-10-CM | POA: Insufficient documentation

## 2013-02-23 DIAGNOSIS — N898 Other specified noninflammatory disorders of vagina: Secondary | ICD-10-CM | POA: Insufficient documentation

## 2013-02-23 DIAGNOSIS — Z3202 Encounter for pregnancy test, result negative: Secondary | ICD-10-CM | POA: Insufficient documentation

## 2013-02-23 LAB — BASIC METABOLIC PANEL
BUN: 7 mg/dL (ref 6–23)
Calcium: 9.5 mg/dL (ref 8.4–10.5)
Chloride: 104 mEq/L (ref 96–112)
Creatinine, Ser: 0.65 mg/dL (ref 0.50–1.10)
GFR calc Af Amer: >90 mL/min (ref >90)
GFR calc non Af Amer: >90 mL/min (ref >90)

## 2013-02-23 LAB — WET PREP, GENITAL: Yeast Wet Prep HPF POC: NONE SEEN

## 2013-02-23 LAB — URINALYSIS, ROUTINE W REFLEX MICROSCOPIC
Bilirubin Urine: NEGATIVE
Glucose, UA: NEGATIVE mg/dL
Ketones, ur: NEGATIVE mg/dL
Protein, ur: NEGATIVE mg/dL
Urobilinogen, UA: 0.2 mg/dL (ref 0.0–1.0)

## 2013-02-23 LAB — CBC
HCT: 41.6 % (ref 36.0–46.0)
MCH: 30.4 pg (ref 26.0–34.0)
MCHC: 33.9 g/dL (ref 30.0–36.0)
MCV: 89.7 fL (ref 78.0–100.0)
Platelets: 263 10*3/uL (ref 150–400)
RDW: 12 % (ref 11.5–15.5)

## 2013-02-23 LAB — URINE MICROSCOPIC-ADD ON

## 2013-02-23 MED ORDER — HYDROCODONE-ACETAMINOPHEN 5-325 MG PO TABS
1.0000 | ORAL_TABLET | Freq: Once | ORAL | Status: AC
  Administered 2013-02-23: 1 via ORAL
  Filled 2013-02-23: qty 1

## 2013-02-23 MED ORDER — METRONIDAZOLE 500 MG PO TABS: 500.0000 mg | ORAL_TABLET | Freq: Two times a day (BID) | ORAL | Status: DC

## 2013-02-23 NOTE — ED Notes (Signed)
Patient transported to Ultrasound 

## 2013-02-23 NOTE — ED Provider Notes (Signed)
History     CSN: [id number]  Arrival date & time 02/23/13  1642   First MD Initiated Contact with Patient 02/23/13 1647      Chief Complaint  Patient presents with  . Dysmenorrhea    (Consider location/radiation/quality/duration/timing/severity/associated sxs/prior treatment) HPI  The patient is a 22 year old female G0 P0 presenting to the emergency department for heavy menstrual cycles with clotting over the last four months lasting 10-14 days in length with associated cramping pelvic pain without radiation. Rates the pain as a 6/10 with no relief from ibuprofen, Tylenol, Midol. Patient states her menstrual cycles have been irregular for the last 3 years but are typically light and only lasting 2-3 days requiring only one pad a day. Patient states she is requiring up to 3 pads a day now. Patient is currently sexually active with the same female partner for the last 6 years. Denies history of sexually transmitted diseases. Denies purulent vaginal discharge, urinary symptoms, fever, chills, nausea, vomiting. The patient states her sister has a history of cysts but is not sure what type of cystic disease her sister has. Denies pelvic or abdominal surgeries.   Past Medical History  Diagnosis Date  . Asthma     Past Surgical History  Procedure Laterality Date  . Adenoidectomy      No family history on file.  History  Substance Use Topics  . Smoking status: Current Every Day Smoker    Types: Cigarettes  . Smokeless tobacco: Not on file  . Alcohol Use: No    OB History   Grav Para Term Preterm Abortions TAB SAB Ect Mult Living                  Review of Systems  Constitutional: Negative for fever and chills.  HENT: Negative.   Eyes: Negative.   Respiratory: Negative for shortness of breath.   Cardiovascular: Negative for chest pain.  Gastrointestinal: Negative for nausea, vomiting and abdominal pain.  Genitourinary: Positive for vaginal bleeding, vaginal pain, menstrual  problem and pelvic pain. Negative for dysuria and vaginal discharge.  Musculoskeletal: Negative.   Skin: Negative.   Neurological: Negative for light-headedness and headaches.    Allergies  Review of patient's allergies indicates no known allergies.  Home Medications   Current Outpatient Rx  Name  Route  Sig  Dispense  Refill  . ibuprofen (ADVIL,MOTRIN) 200 MG tablet   Oral   Take 400 mg by mouth every 8 (eight) hours as needed for pain.         . metroNIDAZOLE (FLAGYL) 500 MG tablet   Oral   Take 1 tablet (500 mg total) by mouth 2 (two) times daily.   14 tablet   0     BP 120/64  Pulse 86  Temp(Src) 98.6 F (37 C) (Oral)  Resp 18  Ht 5' 1.5" (1.562 m)  SpO2 99%  LMP 02/19/2013  Physical Exam  Constitutional: She is oriented to person, place, and time. She appears well-developed and well-nourished.  HENT:  Head: Normocephalic and atraumatic.  Eyes: EOM are normal. Pupils are equal, round, and reactive to light.  Cardiovascular: Normal rate, regular rhythm and normal heart sounds.   Pulmonary/Chest: Effort normal and breath sounds normal.  Abdominal: Soft. Bowel sounds are normal. There is no rigidity, no rebound, no guarding and no CVA tenderness.    Pelvic tenderness.   Neurological: She is alert and oriented to person, place, and time.  Skin: Skin is warm and dry.  Psychiatric: She  has a normal mood and affect.   Exam performed by Francee Piccolo L,  exam chaperoned Date: 02/23/2013 Pelvic exam: normal external genitalia without evidence of trauma. VULVA: normal appearing vulva with no masses, tenderness or lesion. VAGINA: normal appearing vagina with normal color and discharge, no lesions. CERVIX: normal appearing cervix without lesions, cervical motion tenderness absent, cervical os closed with out purulent discharge; vaginal discharge - bloody, Wet prep and DNA probe for chlamydia and GC obtained.   ADNEXA: normal adnexa in size, nontender and no  masses UTERUS: uterus is normal size, shape, consistency and nontender.    ED Course  Procedures (including critical care time)  Labs Reviewed  WET PREP, GENITAL - Abnormal; Notable for the following:    Clue Cells Wet Prep HPF POC RARE (*)    WBC, Wet Prep HPF POC MODERATE (*)    All other components within normal limits  URINALYSIS, ROUTINE W REFLEX MICROSCOPIC - Abnormal; Notable for the following:    Hgb urine dipstick LARGE (*)    All other components within normal limits  CBC - Abnormal; Notable for the following:    WBC 11.0 (*)    All other components within normal limits  URINE MICROSCOPIC-ADD ON - Abnormal; Notable for the following:    Squamous Epithelial / LPF FEW (*)    Bacteria, UA FEW (*)    All other components within normal limits  GC/CHLAMYDIA PROBE AMP  BASIC METABOLIC PANEL  POCT PREGNANCY, URINE   US Transvaginal Non-ob  02/23/2013   *RADIOLOGY REPORT*  Clinical Data:  Pelvic pain, heavy menstruation  TRANSABDOMINAL ULTRASOUND OF PELVIS TRANSVAGINAL ULTRASOUND OF PELVIS DOPPLER ULTRASOUND OF OVARIES  Technique:  Transabdominal and transvaginal ultrasound examination of the pelvis was performed including evaluation of the uterus, ovaries, adnexal regions, and pelvic cul-de-sac. Transvaginal sonographic imaging was necessary to visualize the endometrium adequately.  Color and duplex Doppler ultrasound was utilized to evaluate blood flow to the ovaries.  Comparison:  None  Findings:  Uterus:  Normal in size and appearance at 8 x 4.7 x 5.1 cm.  Endometrium:  Within normal limits for a reproductive age female at 13.2 mm.  Right ovary:  Sonographically unremarkable at 3.5 x 3.5 x 1.3 cm. Both arterial and venous flow are documented on color Doppler ultrasound.  Left ovary:  Sonographically unremarkable at 3.1 x 3.1 x 2.0 cm. Both arterial and venous flow are documented on color Doppler ultrasound.  Pulsed Doppler evaluation demonstrates normal low-resistance arterial and  venous waveforms in both ovaries. Small amount of likely physiologic free fluid within the pelvic cul-de-sac.  The  IMPRESSION: Normal exam.  No evidence of pelvis mass or other significant abnormality.  No sonographic evidence for ovarian torsion.   Original Report Authenticated By: Malachy Moan, M.D.   US Pelvis Complete  02/23/2013   *RADIOLOGY REPORT*  Clinical Data:  Pelvic pain, heavy menstruation  TRANSABDOMINAL ULTRASOUND OF PELVIS TRANSVAGINAL ULTRASOUND OF PELVIS DOPPLER ULTRASOUND OF OVARIES  Technique:  Transabdominal and transvaginal ultrasound examination of the pelvis was performed including evaluation of the uterus, ovaries, adnexal regions, and pelvic cul-de-sac. Transvaginal sonographic imaging was necessary to visualize the endometrium adequately.  Color and duplex Doppler ultrasound was utilized to evaluate blood flow to the ovaries.  Comparison:  None  Findings:  Uterus:  Normal in size and appearance at 8 x 4.7 x 5.1 cm.  Endometrium:  Within normal limits for a reproductive age female at 13.2 mm.  Right ovary:  Sonographically unremarkable at 3.5  x 3.5 x 1.3 cm. Both arterial and venous flow are documented on color Doppler ultrasound.  Left ovary:  Sonographically unremarkable at 3.1 x 3.1 x 2.0 cm. Both arterial and venous flow are documented on color Doppler ultrasound.  Pulsed Doppler evaluation demonstrates normal low-resistance arterial and venous waveforms in both ovaries. Small amount of likely physiologic free fluid within the pelvic cul-de-sac.  The  IMPRESSION: Normal exam.  No evidence of pelvis mass or other significant abnormality.  No sonographic evidence for ovarian torsion.   Original Report Authenticated By: Malachy Moan, M.D.   [place] Art/ven Flow Abd Pelv Doppler  02/23/2013   *RADIOLOGY REPORT*  Clinical Data:  Pelvic pain, heavy menstruation  TRANSABDOMINAL ULTRASOUND OF PELVIS TRANSVAGINAL ULTRASOUND OF PELVIS DOPPLER ULTRASOUND OF OVARIES  Technique:   Transabdominal and transvaginal ultrasound examination of the pelvis was performed including evaluation of the uterus, ovaries, adnexal regions, and pelvic cul-de-sac. Transvaginal sonographic imaging was necessary to visualize the endometrium adequately.  Color and duplex Doppler ultrasound was utilized to evaluate blood flow to the ovaries.  Comparison:  None  Findings:  Uterus:  Normal in size and appearance at 8 x 4.7 x 5.1 cm.  Endometrium:  Within normal limits for a reproductive age female at 13.2 mm.  Right ovary:  Sonographically unremarkable at 3.5 x 3.5 x 1.3 cm. Both arterial and venous flow are documented on color Doppler ultrasound.  Left ovary:  Sonographically unremarkable at 3.1 x 3.1 x 2.0 cm. Both arterial and venous flow are documented on color Doppler ultrasound.  Pulsed Doppler evaluation demonstrates normal low-resistance arterial and venous waveforms in both ovaries. Small amount of likely physiologic free fluid within the pelvic cul-de-sac.  The  IMPRESSION: Normal exam.  No evidence of pelvis mass or other significant abnormality.  No sonographic evidence for ovarian torsion.   Original Report Authenticated By: Malachy Moan, M.D.     1. Menorrhagia       MDM  Pt NAD. VSS stable. Abdomen S/NT/ND. Mild pelvic tenderness. Pelvic exam w/ bloody discharge consistent w/ menstrual cycle, no adnexal fullness, CMT. Labs and imaging reviewed. Pain managed in ED. Patient advised to follow up at [facility] for further evaluation of irregular and heavy periods. Pt d/c w/ treatment for BV. Advised to use symptomatic care at home. Patient agreeable to plan. Patient d/w with Dr. Rubin Payor, agrees with plan. Patient is stable at time of discharge          Jeannetta Ellis, PA-C 02/24/13 1610

## 2013-02-23 NOTE — ED Notes (Signed)
Pt states that over the past couple of months her menstrual cycles have become worse.  Pt states that on Saturday she started and had a lot more bleeding than normal, along with abd cramping and internal vaginal pain.

## 2013-02-24 LAB — GC/CHLAMYDIA PROBE AMP: CT Probe RNA: NEGATIVE

## 2013-02-25 NOTE — ED Provider Notes (Signed)
Medical screening examination/treatment/procedure(s) were performed by non-physician practitioner and as supervising physician I was immediately available for consultation/collaboration.  Alyssia Heese R. Demari Kropp, MD 02/25/13 2305 

## 2013-04-12 ENCOUNTER — Encounter (HOSPITAL_COMMUNITY): Payer: Self-pay | Admitting: *Deleted

## 2013-04-12 ENCOUNTER — Emergency Department (HOSPITAL_COMMUNITY)
Admission: EM | Admit: 2013-04-12 | Discharge: 2013-04-12 | Disposition: A | Discharge status: Home / Self Care | Payer: Self-pay | Attending: Emergency Medicine | Admitting: Emergency Medicine

## 2013-04-12 DIAGNOSIS — Z9089 Acquired absence of other organs: Secondary | ICD-10-CM | POA: Insufficient documentation

## 2013-04-12 DIAGNOSIS — J45909 Unspecified asthma, uncomplicated: Secondary | ICD-10-CM | POA: Insufficient documentation

## 2013-04-12 DIAGNOSIS — J020 Streptococcal pharyngitis: Secondary | ICD-10-CM | POA: Insufficient documentation

## 2013-04-12 DIAGNOSIS — H5710 Ocular pain, unspecified eye: Secondary | ICD-10-CM | POA: Insufficient documentation

## 2013-04-12 DIAGNOSIS — H9209 Otalgia, unspecified ear: Secondary | ICD-10-CM | POA: Insufficient documentation

## 2013-04-12 DIAGNOSIS — R509 Fever, unspecified: Secondary | ICD-10-CM | POA: Insufficient documentation

## 2013-04-12 DIAGNOSIS — F17200 Nicotine dependence, unspecified, uncomplicated: Secondary | ICD-10-CM | POA: Insufficient documentation

## 2013-04-12 LAB — RAPID STREP SCREEN (MED CTR MEBANE ONLY): Streptococcus, Group A Screen (Direct): POSITIVE — AB

## 2013-04-12 MED ORDER — PENICILLIN G BENZATHINE 1200000 UNIT/2ML IM SUSP
1.2000 10*6.[IU] | Freq: Once | INTRAMUSCULAR | Status: AC
  Administered 2013-04-12: 1.2 10*6.[IU] via INTRAMUSCULAR
  Filled 2013-04-12: qty 2

## 2013-04-12 MED ORDER — PREDNISONE 20 MG PO TABS
60.0000 mg | ORAL_TABLET | Freq: Once | ORAL | Status: AC
  Administered 2013-04-12: 60 mg via ORAL
  Filled 2013-04-12: qty 3

## 2013-04-12 MED ORDER — IBUPROFEN 600 MG PO TABS: 600.0000 mg | ORAL_TABLET | Freq: Four times a day (QID) | ORAL | Status: DC | PRN

## 2013-04-12 MED ORDER — FLUCONAZOLE 150 MG PO TABS: 150.0000 mg | ORAL_TABLET | Freq: Every day | ORAL | Status: AC

## 2013-04-12 NOTE — ED Notes (Signed)
Please see triage note

## 2013-04-12 NOTE — ED Notes (Signed)
Pt reports sore throat x 2 days, started to have swollen tonsils today.  Pt reports hx of strep in the past.

## 2013-04-12 NOTE — ED Provider Notes (Signed)
Medical screening examination/treatment/procedure(s) were performed by non-physician practitioner and as supervising physician I was immediately available for consultation/collaboration.  Juliet Rude. Rubin Payor, MD 04/12/13 2156

## 2013-04-12 NOTE — ED Provider Notes (Signed)
History  This chart was scribed for Jaynie Crumble, PA-C working with Juliet Rude. Rubin Payor, MD by Greggory Stallion, ED scribe. This patient was seen in room WTR5/WTR5 and the patient's care was started at 3:05 PM.  CSN: [id number] Arrival date & time 04/12/13  1452   No chief complaint on file.  The history is provided by the patient. No language interpreter was used.    HPI Comments: [name] is a 22 y.o. female who presents to the Emergency Department complaining of gradual onset, constant sore throat with associated fever that started 2 days ago. Pt states she woke up yesterday and had puss pockets in the back of her throat. Pt states she has had some ear pain. She states she gets strep throat pretty often. She states she took ibuprofen with some relief of the fever. Pt denies cough as associated symptoms.   Past Medical History  Diagnosis Date  . Asthma    Past Surgical History  Procedure Laterality Date  . Adenoidectomy     No family history on file. History  Substance Use Topics  . Smoking status: Current Every Day Smoker    Types: Cigarettes  . Smokeless tobacco: Not on file  . Alcohol Use: No   OB History   Grav Para Term Preterm Abortions TAB SAB Ect Mult Living                 Review of Systems  Constitutional: Positive for fever.  HENT: Positive for sore throat.   Eyes: Positive for pain.  Respiratory: Negative for cough.   All other systems reviewed and are negative.    Allergies  Review of patient's allergies indicates no known allergies.  Home Medications   Current Outpatient Rx  Name  Route  Sig  Dispense  Refill  . ibuprofen (ADVIL,MOTRIN) 200 MG tablet   Oral   Take 400 mg by mouth every 8 (eight) hours as needed for pain.         . metroNIDAZOLE (FLAGYL) 500 MG tablet   Oral   Take 1 tablet (500 mg total) by mouth 2 (two) times daily.   14 tablet   0    Pulse 89  Temp(Src) 99.2 F (37.3 C) (Oral)  Resp 18  SpO2  98%  Physical Exam  Nursing note and vitals reviewed. Constitutional: She is oriented to person, place, and time. She appears well-developed and well-nourished. No distress.  HENT:  Head: Normocephalic and atraumatic.  Right Ear: Tympanic membrane and external ear normal.  Left Ear: Tympanic membrane and external ear normal.  Bilateral swollen tonsils. No exudate. Uvula midline.   Eyes: EOM are normal.  Neck: Neck supple. No tracheal deviation present.  Positive cervical lymphadenopathy.   Cardiovascular: Normal rate, regular rhythm and normal heart sounds.   No murmur heard. Pulmonary/Chest: Effort normal and breath sounds normal. No respiratory distress. She has no wheezes. She has no rales.  Musculoskeletal: Normal range of motion.  Neurological: She is alert and oriented to person, place, and time.  Skin: Skin is warm and dry.  Psychiatric: She has a normal mood and affect. Her behavior is normal.    ED Course  Procedures (including critical care time)  DIAGNOSTIC STUDIES: Oxygen Saturation is 98% on RA, normal by my interpretation.    COORDINATION OF CARE: 3:12 PM-Discussed treatment plan which includes antibiotics with pt at bedside and pt agreed to plan.   Labs Reviewed - No data to display No results found.  1.  Strep pharyngitis     MDM  Pt with positive strep screen. Pt chose to be treated with bicillin in ED. 1.2 million unitis given IM. Prednisone 60mg  given for swelling. Home with ibuprofen and follow up. No sings of retropharyngeal or peritonsillar abscess.  Filed Vitals:   04/12/13 1504 04/12/13 1505  BP:  135/87  Pulse: 89   Temp: 99.2 F (37.3 C)   TempSrc: Oral   Resp: 18   SpO2: 98%      I personally performed the services described in this documentation, which was scribed in my presence. The recorded information has been reviewed and is accurate.   Lottie Mussel, PA-C 04/12/13 1543

## 2013-05-02 ENCOUNTER — Emergency Department (HOSPITAL_COMMUNITY)
Admission: EM | Admit: 2013-05-02 | Discharge: 2013-05-02 | Disposition: A | Discharge status: Home / Self Care | Payer: Self-pay | Attending: Emergency Medicine | Admitting: Emergency Medicine

## 2013-05-02 ENCOUNTER — Encounter (HOSPITAL_COMMUNITY): Payer: Self-pay | Admitting: Emergency Medicine

## 2013-05-02 ENCOUNTER — Emergency Department (HOSPITAL_COMMUNITY): Payer: Self-pay

## 2013-05-02 DIAGNOSIS — R040 Epistaxis: Secondary | ICD-10-CM | POA: Insufficient documentation

## 2013-05-02 DIAGNOSIS — R599 Enlarged lymph nodes, unspecified: Secondary | ICD-10-CM | POA: Insufficient documentation

## 2013-05-02 DIAGNOSIS — Z79899 Other long term (current) drug therapy: Secondary | ICD-10-CM | POA: Insufficient documentation

## 2013-05-02 DIAGNOSIS — R059 Cough, unspecified: Secondary | ICD-10-CM | POA: Insufficient documentation

## 2013-05-02 DIAGNOSIS — R05 Cough: Secondary | ICD-10-CM | POA: Insufficient documentation

## 2013-05-02 DIAGNOSIS — B9789 Other viral agents as the cause of diseases classified elsewhere: Secondary | ICD-10-CM | POA: Insufficient documentation

## 2013-05-02 DIAGNOSIS — J3489 Other specified disorders of nose and nasal sinuses: Secondary | ICD-10-CM | POA: Insufficient documentation

## 2013-05-02 DIAGNOSIS — R6883 Chills (without fever): Secondary | ICD-10-CM | POA: Insufficient documentation

## 2013-05-02 DIAGNOSIS — J45909 Unspecified asthma, uncomplicated: Secondary | ICD-10-CM | POA: Insufficient documentation

## 2013-05-02 DIAGNOSIS — H938X9 Other specified disorders of ear, unspecified ear: Secondary | ICD-10-CM | POA: Insufficient documentation

## 2013-05-02 DIAGNOSIS — R61 Generalized hyperhidrosis: Secondary | ICD-10-CM | POA: Insufficient documentation

## 2013-05-02 DIAGNOSIS — B349 Viral infection, unspecified: Secondary | ICD-10-CM

## 2013-05-02 DIAGNOSIS — F17200 Nicotine dependence, unspecified, uncomplicated: Secondary | ICD-10-CM | POA: Insufficient documentation

## 2013-05-02 DIAGNOSIS — Z9889 Other specified postprocedural states: Secondary | ICD-10-CM | POA: Insufficient documentation

## 2013-05-02 MED ORDER — HYDROCODONE-ACETAMINOPHEN 7.5-325 MG/15ML PO SOLN
10.0000 mL | Freq: Once | ORAL | Status: AC
  Administered 2013-05-02: 10 mL via ORAL
  Filled 2013-05-02: qty 15

## 2013-05-02 MED ORDER — HYDROCODONE-ACETAMINOPHEN 7.5-325 MG/15ML PO SOLN: 15.0000 mL | Freq: Four times a day (QID) | ORAL | Status: DC | PRN

## 2013-05-02 MED ORDER — GUAIFENESIN 100 MG/5ML PO LIQD: 100.0000 mg | ORAL | Status: DC | PRN

## 2013-05-02 NOTE — ED Notes (Signed)
Pt c/o runny nose, sore throat, and nasal congestion x3 days.

## 2013-05-02 NOTE — ED Notes (Signed)
Additional note-Pt voided, stated that urine was very small amount and concentrated. Pt also stated that she dank a "RED BULL" energy drink for lunch

## 2013-05-02 NOTE — ED Provider Notes (Signed)
CSN: [id number]     Arrival date & time 05/02/13  1618 History  This chart was scribed for non-physician practitioner, Fayrene Helper, PA-C working with Claudean Kinds, MD by Greggory Stallion, ED scribe. This patient was seen in room WTR5/WTR5 and the patient's care was started at 4:35 PM.   Chief Complaint  Patient presents with  . Sore Throat  . Cough  . Nasal Congestion   Patient is a 22 y.o. female presenting with pharyngitis. The history is provided by the patient. No language interpreter was used.  Sore Throat This is a new problem. The current episode started more than 2 days ago. The problem occurs constantly. The problem has not changed since onset.Pertinent negatives include no abdominal pain. Nothing relieves the symptoms. Treatments tried: sudafed, ibuprofen. The treatment provided no relief.    HPI Comments: [name] is a 22 y.o. female who presents to the Emergency Department complaining of gradual onset, constant sore throat with associated cough, rhinorrhea and congestion that started 3 days ago. She states the sore throat was first and then the cough and congestion started. Pt states she also has chills, sweats, and right ear congestion. Every time she sneezes, her nose starts to bleed. Pt has tried ibuprofen and sudafed with no relief. Pt denies fever, nausea, emesis, diarrhea, fever and rash as associated symptoms. She states family at home has bronchitis. Pt smokes 1 pack of cigarettes per week.   Past Medical History  Diagnosis Date  . Asthma    Past Surgical History  Procedure Laterality Date  . Adenoidectomy     No family history on file. History  Substance Use Topics  . Smoking status: Current Every Day Smoker    Types: Cigarettes  . Smokeless tobacco: Not on file  . Alcohol Use: No   OB History   Grav Para Term Preterm Abortions TAB SAB Ect Mult Living                 Review of Systems  Constitutional: Positive for chills. Negative for fever.   HENT: Positive for nosebleeds, congestion, sore throat and rhinorrhea.   Respiratory: Positive for cough.   Gastrointestinal: Negative for nausea, vomiting, abdominal pain and diarrhea.  Skin: Negative for rash.  All other systems reviewed and are negative.    Allergies  Review of patient's allergies indicates no known allergies.  Home Medications   Current Outpatient Rx  Name  Route  Sig  Dispense  Refill  . ibuprofen (ADVIL,MOTRIN) 200 MG tablet   Oral   Take 400 mg by mouth every 8 (eight) hours as needed for pain.         Marland Kitchen ibuprofen (ADVIL,MOTRIN) 600 MG tablet   Oral   Take 1 tablet (600 mg total) by mouth every 6 (six) hours as needed for pain.   30 tablet   0   . metroNIDAZOLE (FLAGYL) 500 MG tablet   Oral   Take 1 tablet (500 mg total) by mouth 2 (two) times daily.   14 tablet   0    BP 128/58  Pulse 138  Temp(Src) 98.8 F (37.1 C) (Oral)  Resp 16  SpO2 97%  LMP 04/26/2013  Physical Exam  Nursing note and vitals reviewed. Constitutional: She is oriented to person, place, and time. She appears well-developed and well-nourished. No distress.  HENT:  Head: Normocephalic and atraumatic.  Left Ear: Tympanic membrane normal.  Mild tonsillar enlargement, right greater than left. No exudates. Uvula is midline. Evidence  of rhinorrhea. Right TM is retracted.   Eyes: EOM are normal.  Neck: Neck supple. No tracheal deviation present.  Mild anterior cervical lymphadenopatghy.  Cardiovascular: Normal rate.   Pulmonary/Chest: Effort normal. No respiratory distress.  Musculoskeletal: Normal range of motion.  Neurological: She is alert and oriented to person, place, and time.  Skin: Skin is warm and dry.  Psychiatric: She has a normal mood and affect. Her behavior is normal.    ED Course   Procedures (including critical care time)  DIAGNOSTIC STUDIES: Oxygen Saturation is 97% on RA, normal by my interpretation.    COORDINATION OF CARE: 4:55 PM-Discussed  treatment plan which includes cough suppressant with pt at bedside and pt agreed to plan.   5:35 PM Strep neg.  CXR neg.  Likely viral URI.    Labs Reviewed - No data to display Dg Chest 2 View  05/02/2013   *RADIOLOGY REPORT*  Clinical Data: Cough, fever, congestion  CHEST - 2 VIEW  Comparison: 01/27/2012  Findings: Cardiomediastinal silhouette is stable.  No acute infiltrate or pleural effusion.  No pulmonary edema.  Bony thorax is stable.  IMPRESSION: No active disease.  No significant change.   Original Report Authenticated By: Natasha Mead, M.D.   1. Viral syndrome     MDM  BP 128/58  Pulse 112  Temp(Src) 98.8 F (37.1 C) (Oral)  Resp 18  SpO2 98%  LMP 04/26/2013  I have reviewed nursing notes and vital signs. I personally reviewed the imaging tests through PACS system  I reviewed available ER/hospitalization records thought the EMR   I personally performed the services described in this documentation, which was scribed in my presence. The recorded information has been reviewed and is accurate.    Fayrene Helper, PA-C 05/02/13 1751

## 2013-05-03 NOTE — ED Provider Notes (Signed)
Medical screening examination/treatment/procedure(s) were performed by non-physician practitioner and as supervising physician I was immediately available for consultation/collaboration.   Claudean Kinds, MD 05/03/13 [phone]

## 2013-05-04 LAB — CULTURE, GROUP A STREP

## 2013-07-02 ENCOUNTER — Encounter (HOSPITAL_COMMUNITY): Payer: Self-pay | Admitting: Emergency Medicine

## 2013-07-02 ENCOUNTER — Emergency Department (HOSPITAL_COMMUNITY)
Admission: EM | Admit: 2013-07-02 | Discharge: 2013-07-02 | Disposition: A | Discharge status: Home / Self Care | Payer: Self-pay | Attending: Emergency Medicine | Admitting: Emergency Medicine

## 2013-07-02 DIAGNOSIS — J40 Bronchitis, not specified as acute or chronic: Secondary | ICD-10-CM

## 2013-07-02 DIAGNOSIS — J069 Acute upper respiratory infection, unspecified: Secondary | ICD-10-CM | POA: Insufficient documentation

## 2013-07-02 DIAGNOSIS — J45909 Unspecified asthma, uncomplicated: Secondary | ICD-10-CM | POA: Insufficient documentation

## 2013-07-02 DIAGNOSIS — Z79899 Other long term (current) drug therapy: Secondary | ICD-10-CM | POA: Insufficient documentation

## 2013-07-02 DIAGNOSIS — F17200 Nicotine dependence, unspecified, uncomplicated: Secondary | ICD-10-CM | POA: Insufficient documentation

## 2013-07-02 MED ORDER — ALBUTEROL SULFATE HFA 108 (90 BASE) MCG/ACT IN AERS
2.0000 | INHALATION_SPRAY | Freq: Once | RESPIRATORY_TRACT | Status: AC
Start: 2013-07-02 — End: 2013-07-02
  Administered 2013-07-02: 2 via RESPIRATORY_TRACT
  Filled 2013-07-02: qty 6.7

## 2013-07-02 MED ORDER — PREDNISONE 10 MG PO TABS: ORAL_TABLET | ORAL | Status: DC

## 2013-07-02 MED ORDER — PSEUDOEPHEDRINE HCL 30 MG PO TABS: 30.0000 mg | ORAL_TABLET | ORAL | Status: DC | PRN

## 2013-07-02 MED ORDER — BENZONATATE 100 MG PO CAPS: 100.0000 mg | ORAL_CAPSULE | Freq: Three times a day (TID) | ORAL | Status: DC

## 2013-07-02 MED ORDER — PREDNISONE 20 MG PO TABS
60.0000 mg | ORAL_TABLET | Freq: Once | ORAL | Status: AC
  Administered 2013-07-02: 60 mg via ORAL
  Filled 2013-07-02: qty 3

## 2013-07-02 MED ORDER — LORATADINE 10 MG PO TABS: 10.0000 mg | ORAL_TABLET | Freq: Every day | ORAL | Status: DC

## 2013-07-02 NOTE — ED Provider Notes (Signed)
CSN: [id number]     Arrival date & time 07/02/13  1754 History  This chart was scribed for non-physician practitioner Jaynie Crumble, PA-C, working with Audree Camel, MD by Dorothey Baseman, ED Scribe. This patient was seen in room WTR9/WTR9 and the patient's care was started at 6:35 PM.    Chief Complaint  Patient presents with  . Cough   The history is provided by the patient. No language interpreter was used.   HPI Comments: [name] is a 22 y.o. female who presents to the Emergency Department complaining of a dry cough with associated nasal congestion onset 3 days ago. She reports taking Tylenol and Robitussin at home, last dose this morning, with mild, temporary relief. She denies fever. Patient is a current every day smoker. She denies history of DM. Patient reports that she does not currently have a PCP.   Past Medical History  Diagnosis Date  . Asthma    Past Surgical History  Procedure Laterality Date  . Adenoidectomy     No family history on file. History  Substance Use Topics  . Smoking status: Current Every Day Smoker    Types: Cigarettes  . Smokeless tobacco: Not on file  . Alcohol Use: No   OB History   Grav Para Term Preterm Abortions TAB SAB Ect Mult Living                 Review of Systems  Constitutional: Negative for fever.  HENT: Positive for congestion.   Respiratory: Positive for cough.   All other systems reviewed and are negative.    Allergies  Review of patient's allergies indicates no known allergies.  Home Medications   Current Outpatient Rx  Name  Route  Sig  Dispense  Refill  . guaiFENesin (ROBITUSSIN) 100 MG/5ML liquid   Oral   Take 5-10 mLs (100-200 mg total) by mouth every 4 (four) hours as needed for cough.   60 mL   0   . HYDROcodone-acetaminophen (HYCET) 7.5-325 mg/15 ml solution   Oral   Take 15 mLs by mouth 4 (four) times daily as needed for pain.   120 mL   0   . ibuprofen (ADVIL,MOTRIN) 200 MG tablet   Oral    Take 400 mg by mouth every 8 (eight) hours as needed for pain.         . pseudoephedrine (SUDAFED) 30 MG tablet   Oral   Take 30 mg by mouth every 4 (four) hours as needed for congestion.         . Pseudoephedrine-APAP-DM (DAYQUIL MULTI-SYMPTOM COLD/FLU PO)   Oral   Take 1 capsule by mouth daily as needed.          Triage Vitals: BP 136/91  Pulse 112  Temp(Src) 98.9 F (37.2 C) (Oral)  Resp 20  SpO2 98%  Physical Exam  Nursing note and vitals reviewed. Constitutional: She is oriented to person, place, and time. She appears well-developed and well-nourished. No distress.  HENT:  Head: Normocephalic and atraumatic.  Right Ear: External ear normal.  Left Ear: External ear normal.  Mouth/Throat: Oropharynx is clear and moist.  Nose is congested. Eyes are watery.   Eyes: Conjunctivae are normal.  Neck: Normal range of motion. Neck supple.  Cardiovascular: Normal rate, regular rhythm and normal heart sounds.   Pulmonary/Chest: Effort normal and breath sounds normal. No respiratory distress.  Musculoskeletal: Normal range of motion.  Neurological: She is alert and oriented to person, place, and time.  Skin: Skin is warm and dry.  Psychiatric: She has a normal mood and affect. Her behavior is normal.    ED Course  Procedures (including critical care time)  DIAGNOSTIC STUDIES: Oxygen Saturation is 98% on room air, normal by my interpretation.    COORDINATION OF CARE: 6:38PM- Advised patient that symptoms are likely viral. Will discharge patient with a Prednisone nebulizer and advised her to take Sudafed at home. Advised patient to resume taking allergy medications. Advised patient to follow up if symptoms due not subside in 3-4 days. Discussed treatment plan with patient at bedside and patient verbalized agreement.     Labs Review Labs Reviewed - No data to display Imaging Review No results found.  MDM   1. URI (upper respiratory infection)   2. Bronchitis     Patient with cough, congestion, upper respiratory symptoms for 3 days. She is a smoker. Her exam is unremarkable. She is afebrile here. Her oxygen saturation 90% on room air. Patient will be started on albuterol inhaler, prednisone, symptomatic treatment otherwise.  Filed Vitals:   07/02/13 1808 07/02/13 1857  BP: 136/91   Pulse: 112 91  Temp: 98.9 F (37.2 C)   TempSrc: Oral   Resp: 20 20  SpO2: 98% 99%    I personally performed the services described in this documentation, which was scribed in my presence. The recorded information has been reviewed and is accurate.    Lottie Mussel, PA-C 07/02/13 1941

## 2013-07-02 NOTE — ED Notes (Signed)
Pt reports dry cough sinus congestion, unresponsive to OTC meds

## 2013-07-02 NOTE — ED Notes (Signed)
Pt c/o non productive cough x 3 days.

## 2013-07-03 NOTE — ED Provider Notes (Signed)
Medical screening examination/treatment/procedure(s) were performed by non-physician practitioner and as supervising physician I was immediately available for consultation/collaboration.   Audree Camel, MD 07/03/13 2113

## 2013-12-29 ENCOUNTER — Encounter (HOSPITAL_COMMUNITY): Payer: Self-pay | Admitting: Emergency Medicine

## 2013-12-29 ENCOUNTER — Emergency Department (HOSPITAL_COMMUNITY)
Admission: EM | Admit: 2013-12-29 | Discharge: 2013-12-29 | Disposition: A | Discharge status: Home / Self Care | Payer: Self-pay | Attending: Emergency Medicine | Admitting: Emergency Medicine

## 2013-12-29 DIAGNOSIS — Y939 Activity, unspecified: Secondary | ICD-10-CM | POA: Insufficient documentation

## 2013-12-29 DIAGNOSIS — F17200 Nicotine dependence, unspecified, uncomplicated: Secondary | ICD-10-CM | POA: Insufficient documentation

## 2013-12-29 DIAGNOSIS — W57XXXA Bitten or stung by nonvenomous insect and other nonvenomous arthropods, initial encounter: Secondary | ICD-10-CM | POA: Insufficient documentation

## 2013-12-29 DIAGNOSIS — J45909 Unspecified asthma, uncomplicated: Secondary | ICD-10-CM | POA: Insufficient documentation

## 2013-12-29 DIAGNOSIS — S40269A Insect bite (nonvenomous) of unspecified shoulder, initial encounter: Secondary | ICD-10-CM | POA: Insufficient documentation

## 2013-12-29 DIAGNOSIS — Y929 Unspecified place or not applicable: Secondary | ICD-10-CM | POA: Insufficient documentation

## 2013-12-29 IMAGING — US US TRANSVAGINAL NON-OB
1 series · 13 of 25 positions shown · non-contrast
Comparison: None

CLINICAL DATA: Pelvic pain, heavy menstruation

TRANSABDOMINAL ULTRASOUND OF PELVIS
TRANSVAGINAL ULTRASOUND OF PELVIS
DOPPLER ULTRASOUND OF OVARIES
TECHNIQUE: Transabdominal and transvaginal ultrasound examination
of the pelvis was performed including evaluation of the uterus,
ovaries, adnexal regions, and pelvic cul-de-sac. Transvaginal
sonographic imaging was necessary to visualize the endometrium
adequately.
Color and duplex Doppler ultrasound was utilized to evaluate blood
flow to the ovaries.

[Series 1: us transvaginal non-ob · 0.30mm/px · 13 of 90 slices shown]
[im 1/90]
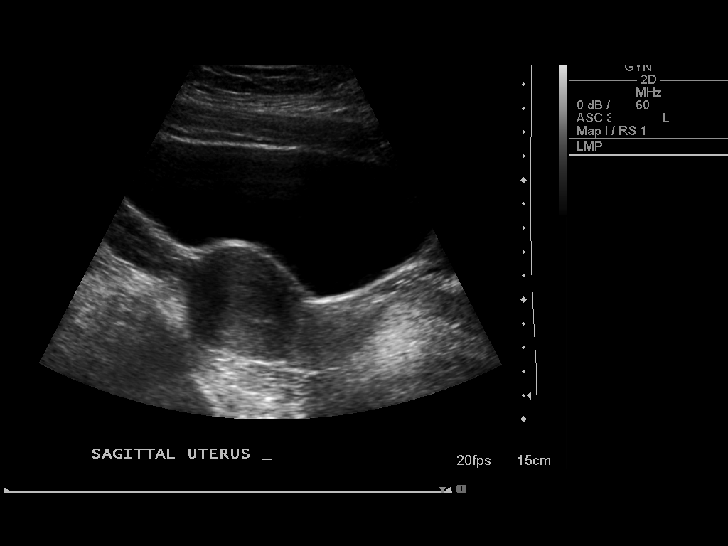
[im 8/90]
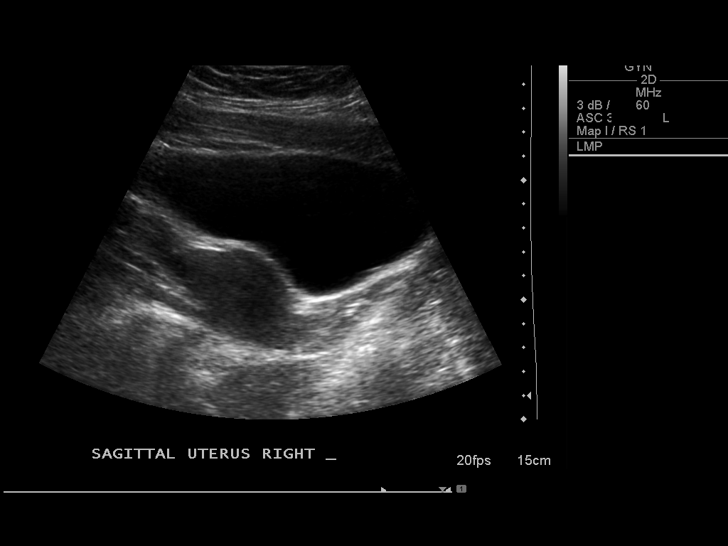
[im 15/90]
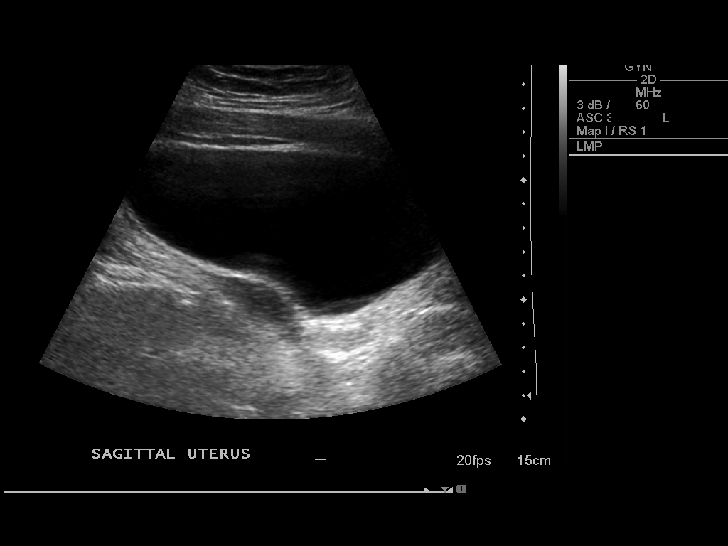
[im 23/90]
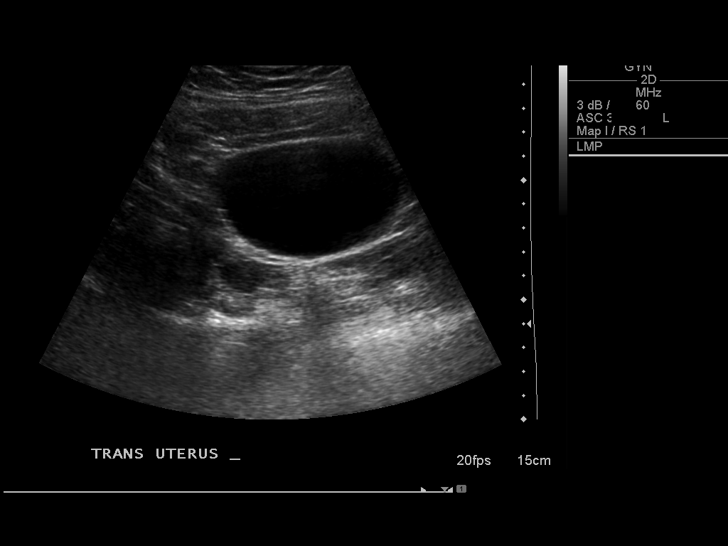
[im 30/90]
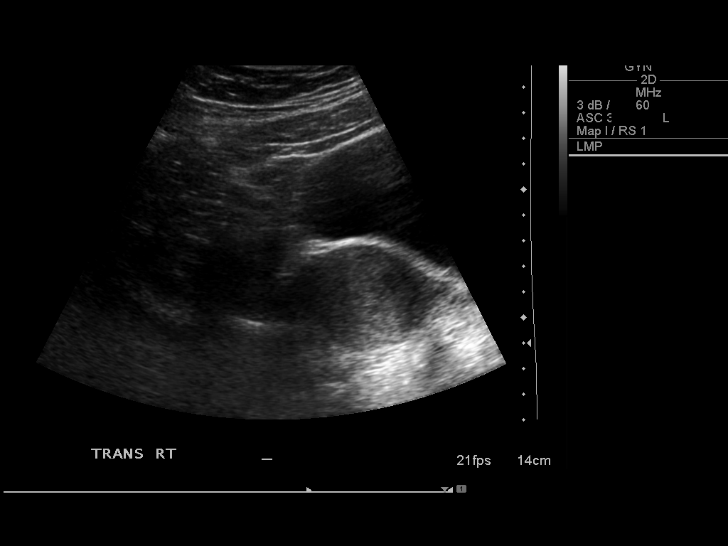
[im 38/90]
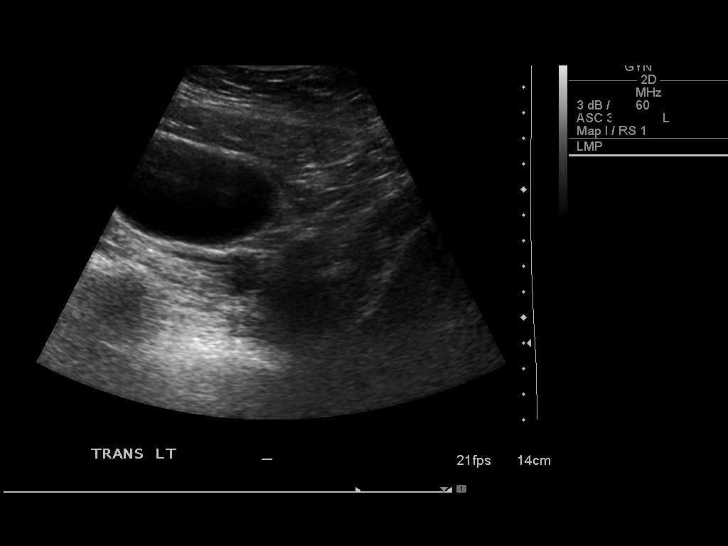
[im 45/90]
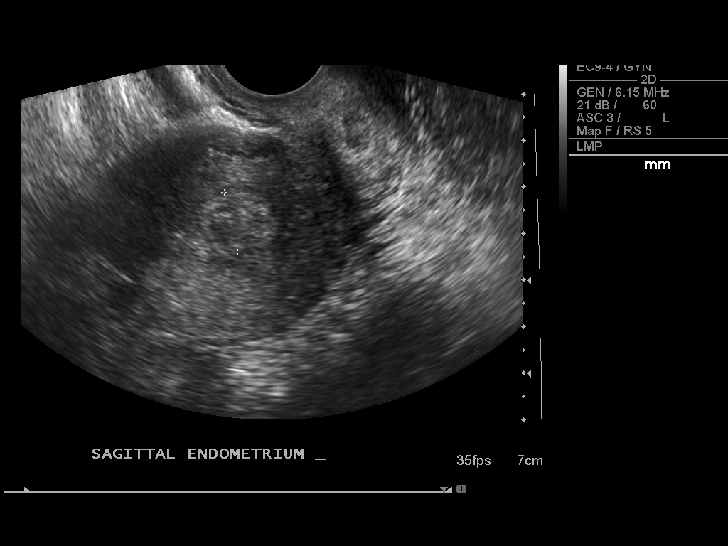
[im 52/90]
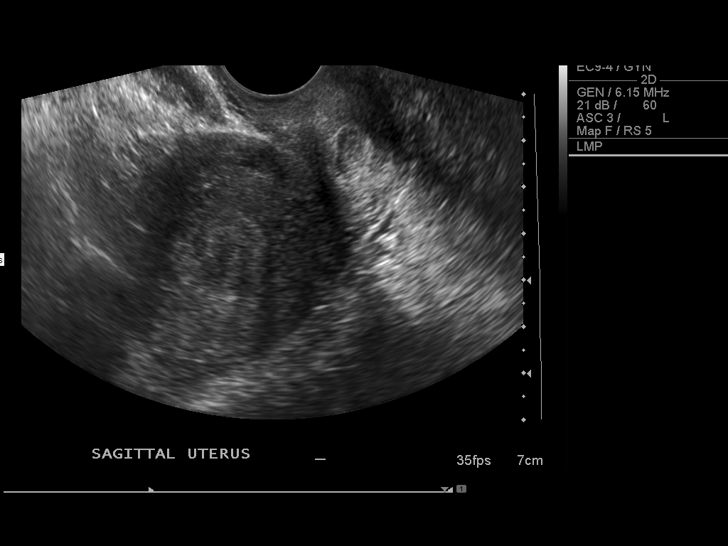
[im 60/90]
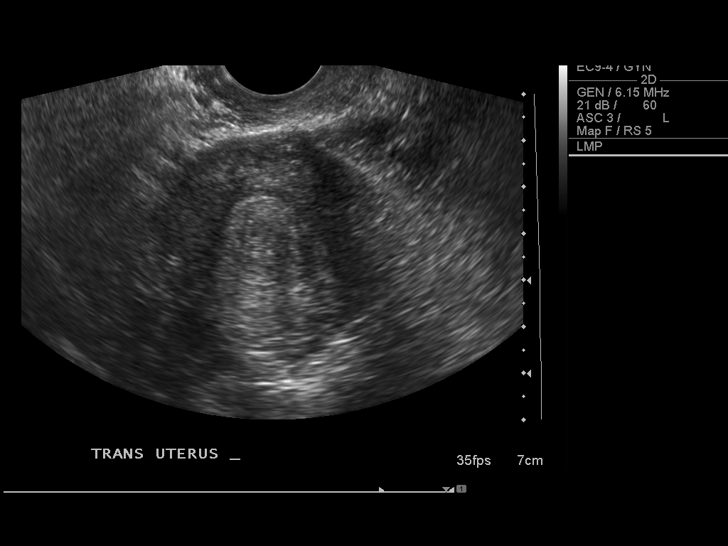
[im 67/90]
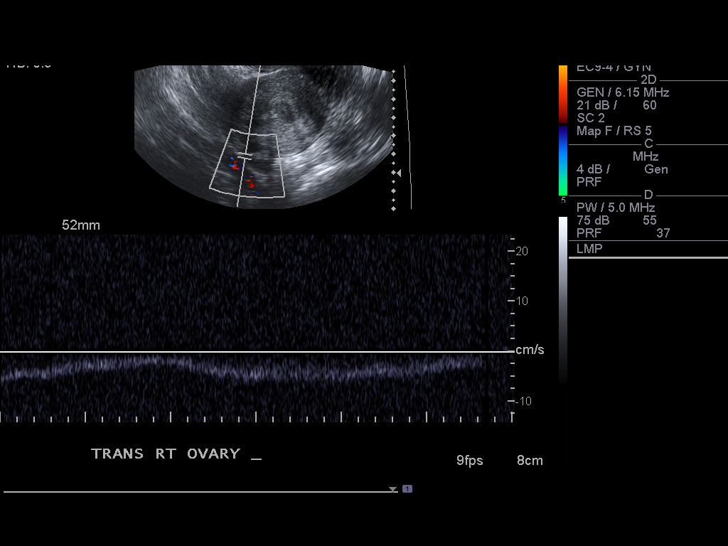
[im 75/90]
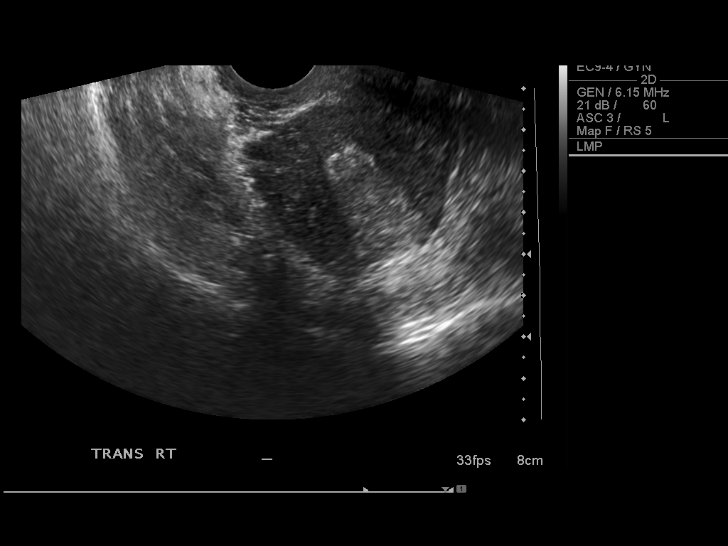
[im 82/90]
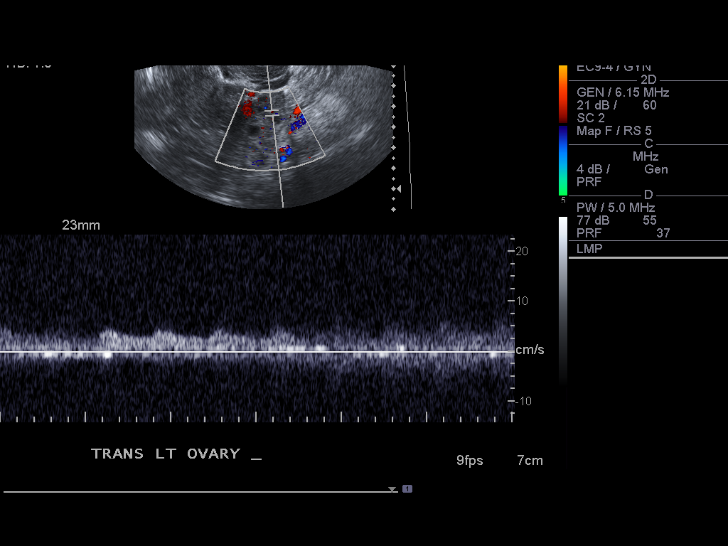
[im 90/90]
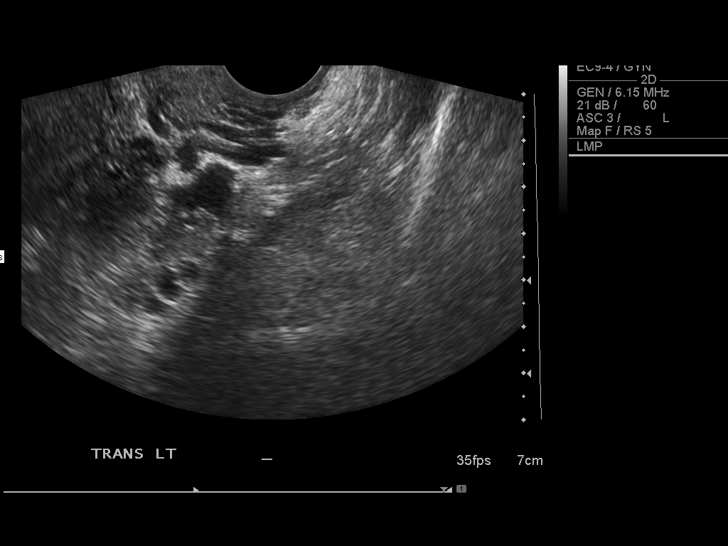

[13 of 25 positions shown; findings below may reference images not displayed]

FINDINGS: Uterus:  Normal in size and appearance at 8 x 4.7 x 5.1 cm.

Endometrium:  Within normal limits for a reproductive age female at
13.2 mm.

Right ovary:  Sonographically unremarkable at 3.5 x 3.5 x 1.3 cm.
Both arterial and venous flow are documented on color Doppler
ultrasound.

Left ovary:  Sonographically unremarkable at 3.1 x 3.1 x 2.0 cm.
Both arterial and venous flow are documented on color Doppler
ultrasound.

Pulsed Doppler evaluation demonstrates normal low-resistance
arterial and venous waveforms in both ovaries. Small amount of
likely physiologic free fluid within the pelvic cul-de-sac.  The
IMPRESSION: Normal exam.  No evidence of pelvis mass or other significant
abnormality.

No sonographic evidence for ovarian torsion.

## 2013-12-29 MED ORDER — PERMETHRIN 5 % EX CREA: TOPICAL_CREAM | CUTANEOUS | Status: DC

## 2013-12-29 NOTE — Discharge Instructions (Signed)
Insect Bite Mosquitoes, flies, fleas, bedbugs, and many other insects can bite. Insect bites are different from insect stings. A sting is when venom is injected into the skin. Some insect bites can transmit infectious diseases. SYMPTOMS  Insect bites usually turn red, swell, and itch for 2 to 4 days. They often go away on their own. TREATMENT  Your caregiver may prescribe antibiotic medicines if a bacterial infection develops in the bite. HOME CARE INSTRUCTIONS  Do not scratch the bite area.  Keep the bite area clean and dry. Wash the bite area thoroughly with soap and water.  Put ice or cool compresses on the bite area.  Put ice in a plastic bag.  Place a towel between your skin and the bag.  Leave the ice on for 20 minutes, 4 times a day for the first 2 to 3 days, or as directed.  You may apply a baking soda paste, cortisone cream, or calamine lotion to the bite area as directed by your caregiver. This can help reduce itching and swelling.  Only take over-the-counter or prescription medicines as directed by your caregiver.  If you are given antibiotics, take them as directed. Finish them even if you start to feel better. You may need a tetanus shot if:  You cannot remember when you had your last tetanus shot.  You have never had a tetanus shot.  The injury broke your skin. If you get a tetanus shot, your arm may swell, get red, and feel warm to the touch. This is common and not a problem. If you need a tetanus shot and you choose not to have one, there is a rare chance of getting tetanus. Sickness from tetanus can be serious. SEEK IMMEDIATE MEDICAL CARE IF:   You have increased pain, redness, or swelling in the bite area.  You see a red line on the skin coming from the bite.  You have a fever.  You have joint pain.  You have a headache or neck pain.  You have unusual weakness.  You have a rash.  You have chest pain or shortness of breath.  You have abdominal pain,  nausea, or vomiting.  You feel unusually tired or sleepy. MAKE SURE YOU:   Understand these instructions.  Will watch your condition.  Will get help right away if you are not doing well or get worse. Document Released: 10/23/2004 Document Revised: 12/08/2011 Document Reviewed: 04/16/2011 [facility] Patient Information 2014 [place].  Bedbugs Bedbugs are tiny bugs that live in and around beds. During the day, they hide in mattresses and other places near beds. They come out at night and bite people lying in bed. They need blood to live and grow. Bedbugs can be found in beds anywhere. Usually, they are found in places where many people come and go (hotels, shelters, hospitals). It does not matter whether the place is dirty or clean. Getting bitten by bedbugs rarely causes a medical problem. The biggest problem can be getting rid of them. This often takes the work of a Financial risk analyst. CAUSES  Less use of pesticides. Bedbugs were common before the 1950s. Then, strong pesticides such as DDT nearly wiped them out. Today, these pesticides are not used because they harm the environment and can cause health problems.  More travel. Besides mattresses, bedbugs can also live in clothing and luggage. They can come along as people travel from place to place. Bedbugs are more common in certain parts of the world. When people travel to  those areas, the bugs can come home with them.  Presence of birds and bats. Bedbugs often infest birds and bats. If you have these animals in or near your home, bedbugs may infest your house, too. SYMPTOMS It does not hurt to be bitten by a bedbug. You will probably not wake up when you are bitten. Bedbugs usually bite areas of the skin that are not covered. Symptoms may show when you wake up, or they may take a day or more to show up. Symptoms may include:  Small red bumps on the skin. These might be lined up in a row or clustered in a group.  A darker red dot  in the middle of red bumps.  Blisters on the skin. There may be swelling and very bad itching. These may be signs of an allergic reaction. This does not happen often. DIAGNOSIS Bedbug bites might look and feel like other types of insect bites. The bugs do not stay on the body like ticks or lice. They bite, drop off, and crawl away to hide. Your caregiver will probably:  Ask about your symptoms.  Ask about your recent activities and travel.  Check your skin for bedbug bites.  Ask you to check at home for signs of bedbugs. You should look for:  Spots or stains on the bed or nearby. This could be from bedbugs that were crushed or from their eggs or waste.  Bedbugs themselves. They are reddish-brown, oval, and flat. They do not fly. They are about the size of an apple seed.  Places to look for bedbugs include:  Beds. Check mattresses, headboards, box springs, and bed frames.  On drapes and curtains near the bed.  Under carpeting in the bedroom.  Behind electrical outlets.  Behind any wallpaper that is peeling.  Inside luggage. TREATMENT Most bedbug bites do not need treatment. They usually go away on their own in a few days. The bites are not dangerous. However, treatment may be needed if you have scratched so much that your skin has become infected. You may also need treatment if you are allergic to bedbug bites. Treatment options include:  A drug that stops swelling and itching (corticosteroid). Usually, a cream is rubbed on the skin. If you have a bad rash, you may be given a corticosteroid pill.  Oral antihistamines. These are pills to help control itching.  Antibiotic medicines. An antibiotic may be prescribed for infected skin. HOME CARE INSTRUCTIONS   Take any medicine prescribed by your caregiver for your bites. Follow the directions carefully.  Consider wearing pajamas with long sleeves and pant legs.  Your bedroom may need to be treated. A pest control expert should  make sure the bedbugs are gone. You may need to throw away mattresses or luggage. Ask the pest control expert what you can do to keep the bedbugs from coming back. Common suggestions include:  Putting a plastic cover over your mattress.  Washing and drying your clothes and bedding in hot water and a hot dryer. The temperature should be hotter than 120 F (48.9 C). Bedbugs are killed by high temperatures.  Vacuuming carefully all around your bed. Vacuum in all cracks and crevices where the bugs might hide. Do this often.  Carefully checking all used furniture, bedding, or clothes that you bring into your house.  Eliminating bird nests and bat roosts.  If you get bedbug bites when traveling, check all your possessions carefully before bringing them into your house. If you find any  bugs on clothes or in your luggage, consider throwing those items away. SEEK MEDICAL CARE IF:  You have red bug bites that keep coming back.  You have red bug bites that itch badly.  You have bug bites that cause a skin rash.  You have scratch marks that are red and sore. SEEK IMMEDIATE MEDICAL CARE IF: You have a fever. Document Released: 10/18/2010 Document Revised: 12/08/2011 Document Reviewed: 10/18/2010 [facility] Patient Information 2014 [place], [place].

## 2013-12-29 NOTE — ED Notes (Signed)
Pt c/o bumps to R arm and R side x 2 days. Areas are red raised bumps that are itchy. Now bumps have spread to legs and trunk. Pt has used benadryl and hydrocortisone, but it is not helping much. Pt is concerned she has chicken pox. Pt has no acute distress.

## 2013-12-29 NOTE — ED Provider Notes (Signed)
CSN: [id number]     Arrival date & time 12/29/13  1913 History  This chart was scribed for non-physician practitioner Montine Circle, PA-C working with Babette Relic, MD by Eston Mould, ED Scribe. This patient was seen in room [place] and the patient's care was started at 7:35 PM .   Chief Complaint  Patient presents with  . Rash   The history is provided by the patient. No language interpreter was used.   HPI Comments: [name] is a 23 y.o. female who presents to the Emergency Department complaining of ongoing rash with itching that began on R arm and spread to entire body that began 2 days ago. Pt states she has noticed the rash to have spread throughout her body (trunk and extremities). She states she is staying at her parents house and a friends home. She denies any other individuals in her home with same rash. Pt states she has tried taking Benadryl, Aveeno lotion, and Cortisone cream and denies relief. Pt denies having a PCP. Pt denies fever.  Past Medical History  Diagnosis Date  . Asthma    Past Surgical History  Procedure Laterality Date  . Adenoidectomy     Family History  Problem Relation Age of Onset  . Diabetes Father    History  Substance Use Topics  . Smoking status: Current Every Day Smoker    Types: Cigarettes  . Smokeless tobacco: Not on file  . Alcohol Use: No   OB History   Grav Para Term Preterm Abortions TAB SAB Ect Mult Living                 Review of Systems  Constitutional: Negative for fever.  Skin: Positive for color change and rash.   Allergies  Review of patient's allergies indicates no known allergies.  Home Medications   Current Outpatient Rx  Name  Route  Sig  Dispense  Refill  . diphenhydrAMINE (BENADRYL) 25 mg capsule   Oral   Take 50 mg by mouth every 6 (six) hours as needed for itching.         Marland Kitchen ibuprofen (ADVIL,MOTRIN) 200 MG tablet   Oral   Take 800 mg by mouth every 6 (six) hours as needed for  moderate pain.          Triage Vitals:BP 132/89  Pulse 90  Temp(Src) 98.6 F (37 C) (Oral)  Resp 17  SpO2 99%  Physical Exam  Nursing note and vitals reviewed. Constitutional: She is oriented to person, place, and time. She appears well-developed and well-nourished. No distress.  HENT:  Head: Normocephalic and atraumatic.  Eyes: EOM are normal.  Neck: Neck supple. No tracheal deviation present.  Cardiovascular: Normal rate.   Pulmonary/Chest: Effort normal. No respiratory distress.  Musculoskeletal: Normal range of motion.  Neurological: She is alert and oriented to person, place, and time.  Skin: Skin is warm and dry.  Scattered bug bites to the extremities sparing the palms and soles. No evidence of cellulitis or abscess. No targocoid lesions.   Psychiatric: She has a normal mood and affect. Her behavior is normal.   ED Course  Procedures  DIAGNOSTIC STUDIES: Oxygen Saturation is 99% on RA, normal by my interpretation.    COORDINATION OF CARE: 7:40 PM-Discussed treatment plan which includes discharge pt with [facility] and continue using Benadryl. Will recommend pt to dermatologist. Advised pt to return to ED if she presents a fever. Pt agreed to plan.   Labs Review Labs Reviewed -  No data to display Imaging Review No results found.   EKG Interpretation None     MDM   Final diagnoses:  Bug bites    Patient with bed bug bites. Will treat with permethrin. Continue hydrocortisone cream and Benadryl. Recommend washing sheets, and using bug spray. Will discharge to home with primary care followup in one to 2 weeks, or dermatology in one week if symptoms do not improve  I personally performed the services described in this documentation, which was scribed in my presence. The recorded information has been reviewed and is accurate.    Montine Circle, PA-C 12/29/13 1953

## 2013-12-30 NOTE — ED Provider Notes (Signed)
Medical screening examination/treatment/procedure(s) were performed by non-physician practitioner and as supervising physician I was immediately available for consultation/collaboration.   Babette Relic, MD 12/30/13 [phone]

## 2017-03-20 ENCOUNTER — Encounter (HOSPITAL_COMMUNITY): Payer: Self-pay

## 2017-03-20 ENCOUNTER — Observation Stay (HOSPITAL_COMMUNITY)
Admission: EM | Admit: 2017-03-20 | Discharge: 2017-03-21 | Disposition: A | Discharge status: Home / Self Care | Payer: BLUE CROSS/BLUE SHIELD | Attending: Internal Medicine | Admitting: Internal Medicine

## 2017-03-20 DIAGNOSIS — R0602 Shortness of breath: Secondary | ICD-10-CM | POA: Diagnosis not present

## 2017-03-20 DIAGNOSIS — D509 Iron deficiency anemia, unspecified: Secondary | ICD-10-CM | POA: Diagnosis not present

## 2017-03-20 DIAGNOSIS — N921 Excessive and frequent menstruation with irregular cycle: Secondary | ICD-10-CM | POA: Insufficient documentation

## 2017-03-20 DIAGNOSIS — R531 Weakness: Secondary | ICD-10-CM | POA: Insufficient documentation

## 2017-03-20 DIAGNOSIS — J45909 Unspecified asthma, uncomplicated: Secondary | ICD-10-CM | POA: Insufficient documentation

## 2017-03-20 DIAGNOSIS — R000 Tachycardia, unspecified: Secondary | ICD-10-CM | POA: Insufficient documentation

## 2017-03-20 DIAGNOSIS — F17210 Nicotine dependence, cigarettes, uncomplicated: Secondary | ICD-10-CM | POA: Insufficient documentation

## 2017-03-20 DIAGNOSIS — Z833 Family history of diabetes mellitus: Secondary | ICD-10-CM | POA: Insufficient documentation

## 2017-03-20 DIAGNOSIS — F17200 Nicotine dependence, unspecified, uncomplicated: Secondary | ICD-10-CM | POA: Diagnosis present

## 2017-03-20 DIAGNOSIS — D649 Anemia, unspecified: Secondary | ICD-10-CM | POA: Diagnosis not present

## 2017-03-20 LAB — IRON AND TIBC
Iron: 7 ug/dL — ABNORMAL LOW (ref 28–170)
SATURATION RATIOS: 2 % — AB (ref 10.4–31.8)
TIBC: 413 ug/dL (ref 250–450)
UIBC: 406 ug/dL

## 2017-03-20 LAB — FOLATE: Folate: 6.4 ng/mL (ref >5.9)

## 2017-03-20 LAB — BASIC METABOLIC PANEL
Anion gap: 8 (ref 5–15)
BUN: 8 mg/dL (ref 6–20)
CALCIUM: 8.9 mg/dL (ref 8.9–10.3)
CHLORIDE: 107 mmol/L (ref 101–111)
CO2: 22 mmol/L (ref 22–32)
CREATININE: 0.62 mg/dL (ref 0.44–1.00)
GFR calc Af Amer: >60 mL/min (ref >60)
GFR calc non Af Amer: >60 mL/min (ref >60)
Glucose, Bld: 89 mg/dL (ref 65–99)
Potassium: 3.5 mmol/L (ref 3.5–5.1)
Sodium: 137 mmol/L (ref 135–145)

## 2017-03-20 LAB — I-STAT BETA HCG BLOOD, ED (MC, WL, AP ONLY): I-stat hCG, quantitative: <5 m[IU]/mL (ref <5)

## 2017-03-20 LAB — CBC
HEMATOCRIT: 15.3 % — AB (ref 36.0–46.0)
Hemoglobin: 4.6 g/dL — CL (ref 12.0–15.0)
MCH: 19.7 pg — ABNORMAL LOW (ref 26.0–34.0)
MCHC: 30.1 g/dL (ref 30.0–36.0)
MCV: 65.7 fL — ABNORMAL LOW (ref 78.0–100.0)
Platelets: 157 10*3/uL (ref 150–400)
RBC: 2.33 MIL/uL — ABNORMAL LOW (ref 3.87–5.11)
RDW: 18 % — AB (ref 11.5–15.5)
WBC: 10 10*3/uL (ref 4.0–10.5)

## 2017-03-20 LAB — RETICULOCYTES
RBC.: 2.33 MIL/uL — ABNORMAL LOW (ref 3.87–5.11)
RETIC COUNT ABSOLUTE: 39.6 10*3/uL (ref 19.0–186.0)
Retic Ct Pct: 1.7 % (ref 0.4–3.1)

## 2017-03-20 LAB — FERRITIN: FERRITIN: 7 ng/mL — AB (ref 11–307)

## 2017-03-20 LAB — PREPARE RBC (CROSSMATCH)

## 2017-03-20 LAB — VITAMIN B12: Vitamin B-12: 417 pg/mL (ref 180–914)

## 2017-03-20 LAB — LACTATE DEHYDROGENASE: LDH: 150 U/L (ref 98–192)

## 2017-03-20 MED ORDER — SODIUM CHLORIDE 0.9 % IV SOLN
Freq: Once | INTRAVENOUS | Status: AC
  Administered 2017-03-20: 19:00:00 via INTRAVENOUS

## 2017-03-20 MED ORDER — MEDROXYPROGESTERONE ACETATE 10 MG PO TABS
10.0000 mg | ORAL_TABLET | Freq: Every day | ORAL | Status: DC
  Administered 2017-03-20 – 2017-03-21 (×2): 10 mg via ORAL
  Filled 2017-03-20 (×2): qty 1

## 2017-03-20 MED ORDER — ONDANSETRON HCL 4 MG/2ML IJ SOLN: 4.0000 mg | Freq: Four times a day (QID) | INTRAMUSCULAR | Status: DC | PRN

## 2017-03-20 MED ORDER — DIPHENHYDRAMINE HCL 50 MG/ML IJ SOLN
25.0000 mg | Freq: Four times a day (QID) | INTRAMUSCULAR | Status: DC | PRN
  Administered 2017-03-20: 25 mg via INTRAVENOUS
  Filled 2017-03-20: qty 1

## 2017-03-20 MED ORDER — ONDANSETRON HCL 4 MG PO TABS: 4.0000 mg | ORAL_TABLET | Freq: Four times a day (QID) | ORAL | Status: DC | PRN

## 2017-03-20 MED ORDER — FERROUS SULFATE 325 (65 FE) MG PO TABS
325.0000 mg | ORAL_TABLET | Freq: Three times a day (TID) | ORAL | Status: DC
  Administered 2017-03-21 (×2): 325 mg via ORAL
  Filled 2017-03-20 (×2): qty 1

## 2017-03-20 MED ORDER — ACETAMINOPHEN 325 MG PO TABS
650.0000 mg | ORAL_TABLET | Freq: Four times a day (QID) | ORAL | Status: DC | PRN
  Administered 2017-03-20: 650 mg via ORAL
  Filled 2017-03-20: qty 2

## 2017-03-20 NOTE — ED Notes (Signed)
Date and time results received: 03/20/17 5:39 PM   Test: Hemoglobin Critical Value: 4.6  Name of Provider Notified: Dr. Venora Maples  Orders Received? Or Actions Taken?: Actions Taken: Dr. Venora Maples notified

## 2017-03-20 NOTE — Progress Notes (Signed)
Patient admitted from Emergency department with 1 unit of blood transfusing,pt was assessed . We will continue to monitor.

## 2017-03-20 NOTE — ED Notes (Addendum)
After pre-medicating pt, Nurse was instructed to re-start blood  Blood restarted at 60 ml's per hour

## 2017-03-20 NOTE — ED Triage Notes (Signed)
Pt reports that her PCP sent her here today after reviewing her lab results. She reports a Hgb of 3.7. She also endorses lethargy, heavy menstruation, and "episode" that she thinks could be seizures. Pt is pale in triage. A&Ox4. Ambulatory

## 2017-03-20 NOTE — H&P (Signed)
History and Physical    [name]:[id number] DOB: [birth year] DOA: 03/20/2017  PCP: System, Provider Not In   Patient coming from: PCPs office.  I have personally briefly reviewed patient's old medical records in [facility] [place]  Chief Complaint: Abnormal lab results.  HPI: [name] is a 26 y.o. female with medical history significant of asthma was coming to the emergency department referred by her PCP due to having a hemoglobin level of 3.7 g/dL. The patient states that she has a history of heavy menstrual periods for over a year. She went once to see GYN last year, but did not follow up with them due to therapeutic approach disagreement. She mentions that she was instructed to lose weight, but no workup or medications were provided. She now complains of progressively worse dyspnea, fatigue and somnolence. She complains of lower abdominal cramping, but denies hematochezia or melena.  ED Course: Initial vital signs temperature 98.7, pulse 110, respirations 16, blood pressure 120/71 and O2 sat 95% on room air. Hemoglobin level in the emergency department was 4.6 g/dL. Four units of packed RBCs were ordered. She had a mild increase in temperature initially while being transfused the first unit, but these subsided with oral acetaminophen.  Review of Systems: As per HPI otherwise 10 point review of systems negative.   Past Medical History:  Diagnosis Date  . Asthma     Past Surgical History:  Procedure Laterality Date  . ADENOIDECTOMY       reports that she has been smoking Cigarettes.  She does not have any smokeless tobacco history on file. She reports that she does not drink alcohol or use drugs.  No Known Allergies  Family History  Problem Relation Age of Onset  . Diabetes Father     Prior to Admission medications   Medication Sig Start Date End Date Taking? Authorizing Provider  naproxen (NAPROSYN) 500 MG tablet Take 500 mg by mouth 2 (two) times daily as  needed for mild pain or moderate pain.   Yes [provider]  permethrin (ELIMITE) 5 % cream Apply to entire body other than face - let sit for 14 hours then wash off, may repeat in 1 week if still having symptoms Patient not taking: Reported on 03/20/2017 12/29/13   Montine Circle, PA-C    Physical Exam: Vitals:   03/20/17 1610 03/20/17 1748 03/20/17 1853 03/20/17 1855  BP: 120/71 114/70  108/74  Pulse: (!) 110 (!) 103 (!) 104   Resp: 16 16 (!) 24   Temp: 98.7 F (37.1 C)  98.6 F (37 C) 98.6 F (37 C)  TempSrc: Oral     SpO2: 95% 100% 94%     Constitutional: NAD, calm, comfortable Eyes: PERRL, lids and conjunctivae are pale. ENMT: Mucous membranes are moist. Posterior pharynx clear of any exudate or lesions. Neck: normal, supple, no masses, no thyromegaly Respiratory: clear to auscultation bilaterally, no wheezing, no crackles. Normal respiratory effort. No accessory muscle use.  Cardiovascular: Regular rate and rhythm, no murmurs / rubs / gallops. No extremity edema. 2+ pedal pulses. No carotid bruits.  Abdomen: Positive lower quadrants tenderness, no guarding/rebound/masses palpated. No hepatosplenomegaly. Bowel sounds positive.  Musculoskeletal: no clubbing / cyanosis. Good ROM, no contractures. Normal muscle tone.  Skin: no significant rashes, lesions, ulcers on limited skin exam Neurologic: CN 2-12 grossly intact. Sensation intact, DTR normal. Strength 5/5 in all 4.  Psychiatric: Normal judgment and insight. Alert and oriented x 4. Normal mood.  Labs on Admission: I have personally reviewed following labs and imaging studies  CBC:  Recent Labs Lab 03/20/17 1604  WBC 10.0  HGB 4.6*  HCT 15.3*  MCV 65.7*  PLT 024   Basic Metabolic Panel:  Recent Labs Lab 03/20/17 1604  NA 137  K 3.5  CL 107  CO2 22  GLUCOSE 89  BUN 8  CREATININE 0.62  CALCIUM 8.9   GFR: CrCl cannot be calculated (Unknown ideal weight.). Liver Function Tests: No results for  input(s): AST, ALT, ALKPHOS, BILITOT, PROT, ALBUMIN in the last 168 hours. No results for input(s): LIPASE, AMYLASE in the last 168 hours. No results for input(s): AMMONIA in the last 168 hours. Coagulation Profile: No results for input(s): INR, PROTIME in the last 168 hours. Cardiac Enzymes: No results for input(s): CKTOTAL, CKMB, CKMBINDEX, TROPONINI in the last 168 hours. BNP (last 3 results) No results for input(s): PROBNP in the last 8760 hours. HbA1C: No results for input(s): HGBA1C in the last 72 hours. CBG: No results for input(s): GLUCAP in the last 168 hours. Lipid Profile: No results for input(s): CHOL, HDL, LDLCALC, TRIG, CHOLHDL, LDLDIRECT in the last 72 hours. Thyroid Function Tests: No results for input(s): TSH, T4TOTAL, FREET4, T3FREE, THYROIDAB in the last 72 hours. Anemia Panel:  Recent Labs  03/20/17 1604  RETICCTPCT 1.7   Urine analysis:    Component Value Date/Time   COLORURINE YELLOW 02/23/2013 [place] 02/23/2013 1718   LABSPEC 1.008 02/23/2013 1718   PHURINE 7.5 02/23/2013 1718   GLUCOSEU NEGATIVE 02/23/2013 1718   HGBUR LARGE (A) 02/23/2013 1718   BILIRUBINUR NEGATIVE 02/23/2013 1718   KETONESUR NEGATIVE 02/23/2013 1718   PROTEINUR NEGATIVE 02/23/2013 1718   UROBILINOGEN 0.2 02/23/2013 1718   NITRITE NEGATIVE 02/23/2013 1718   LEUKOCYTESUR NEGATIVE 02/23/2013 1718    Radiological Exams on Admission: No results found.   Assessment/Plan Principal Problem:   Symptomatic anemia Observation/MedSurg. Continue PRBC transfusion. Continue Provera 10 mg by mouth daily. Follow-up hematocrit and hemoglobin in a.m. Check pelvic ultrasound in a.m.  Active Problems:   Asthma Has not had issues in years. Bronchodilators as needed.    Tobacco use disorder Declined nicotine replacement therapy. Advised that smoking with Provera therapy may increase thromboembolic events.   DVT prophylaxis: SCDs. Code Status: Full code. Family  Communication: Her mother was present in the room. Disposition Plan: Observation for blood transfusion and monitoring. Consults called:  Admission status: Observation/telemetry.   Reubin Milan MD [facility] Pager [phone].  If 7PM-7AM, please contact night-coverage [email or web address] Password [facility]  03/20/2017, 7:11 PM

## 2017-03-20 NOTE — ED Notes (Signed)
At 15 minute check of vitals nurse noticed that Pt's temperature had increased more than 1 point. Pt also stated she was having pain in her back. Nurse stopped the infusion immediately. ED provider and Admitting Dr. Arville Go aware.

## 2017-03-20 NOTE — ED Provider Notes (Signed)
[place] DEPT Provider Note   CSN: [id number] Arrival date & time: 03/20/17  1604     History   Chief Complaint Chief Complaint  Patient presents with  . Abnormal Lab  . Tachycardia    HPI [name] is a 26 y.o. female.  HPI Patient presents to emergency department with over a year menorrhagia with no clear etiology.  She's on OB/GYN once he recommended that she lose weight but no additional testing was performed.  She's continued having heavy menstrual cycles.  She presents now with increasing generalized weakness and shortness of breath with exertion over the past 2 weeks.  She went saw her primary care doctor today who noted a hemoglobin 3.7 inserted the ER for evaluation.  She states she feels weak.  No chest pain or palpitations at this time.  She's still has a small amount of ongoing vaginal bleeding.   Past Medical History:  Diagnosis Date  . Asthma     There are no active problems to display for this patient.   Past Surgical History:  Procedure Laterality Date  . ADENOIDECTOMY      OB History    No data available       Home Medications    Prior to Admission medications   Medication Sig Start Date End Date Taking? Authorizing Provider  naproxen (NAPROSYN) 500 MG tablet Take 500 mg by mouth 2 (two) times daily as needed for mild pain or moderate pain.   Yes [provider]  permethrin (ELIMITE) 5 % cream Apply to entire body other than face - let sit for 14 hours then wash off, may repeat in 1 week if still having symptoms Patient not taking: Reported on 03/20/2017 12/29/13   Montine Circle, PA-C    Family History Family History  Problem Relation Age of Onset  . Diabetes Father     Social History Social History  Substance Use Topics  . Smoking status: Current Every Day Smoker    Types: Cigarettes  . Smokeless tobacco: Not on file  . Alcohol use No     Allergies   Patient has no known allergies.   Review of  Systems Review of Systems  All other systems reviewed and are negative.    Physical Exam Updated Vital Signs BP 108/74   Pulse (!) 104   Temp 98.6 F (37 C)   Resp (!) 24   SpO2 94%   Physical Exam  Constitutional: She is oriented to person, place, and time. She appears well-developed and well-nourished. No distress.  HENT:  Head: Normocephalic and atraumatic.  Eyes: EOM are normal.  Neck: Normal range of motion.  Cardiovascular: Normal rate and regular rhythm.   Pulmonary/Chest: Effort normal and breath sounds normal.  Abdominal: Soft. She exhibits no distension. There is no tenderness.  Musculoskeletal: Normal range of motion.  Neurological: She is alert and oriented to person, place, and time.  Skin: Skin is warm and dry.  Psychiatric: She has a normal mood and affect. Judgment normal.  Nursing note and vitals reviewed.    ED Treatments / Results  Labs (all labs ordered are listed, but only abnormal results are displayed) Labs Reviewed  CBC - Abnormal; Notable for the following:       Result Value   RBC 2.33 (*)    Hemoglobin 4.6 (*)    HCT 15.3 (*)    MCV 65.7 (*)    MCH 19.7 (*)    RDW 18.0 (*)    All  other components within normal limits  RETICULOCYTES - Abnormal; Notable for the following:    RBC. 2.33 (*)    All other components within normal limits  BASIC METABOLIC PANEL  LACTATE DEHYDROGENASE  VITAMIN B12  FOLATE  IRON AND TIBC  FERRITIN  I-STAT BETA HCG BLOOD, ED ([facility], [facility], [facility] ONLY)  TYPE AND SCREEN  PREPARE RBC (CROSSMATCH)  ABO/RH    EKG  EKG Interpretation None       Radiology No results found.  Procedures .Critical Care Performed by: Jola Schmidt Authorized by: Jola Schmidt     Total critical care time: 33 minutes Critical care time was exclusive of separately billable procedures and treating other patients. Critical care was necessary to treat or prevent imminent or life-threatening deterioration. Critical care was time  spent personally by me on the following activities: development of treatment plan with patient and/or surrogate as well as nursing, discussions with consultants, evaluation of patient's response to treatment, examination of patient, obtaining history from patient or surrogate, ordering and performing treatments and interventions, ordering and review of laboratory studies, ordering and review of radiographic studies, pulse oximetry and re-evaluation of patient's condition.   Medications Ordered in ED Medications  0.9 %  sodium chloride infusion (not administered)  medroxyPROGESTERone (PROVERA) tablet 10 mg (not administered)     Initial Impression / Assessment and Plan / ED Course  I have reviewed the triage vital signs and the nursing notes.  Pertinent labs & imaging results that were available during my care of the patient were reviewed by me and considered in my medical decision making (see chart for details).     Patient with menorrhagia and symptomatic anemia.  Hemoglobin here in the ER is 4 and half.  Patient be transfused 4 units of blood.  Hospitalist admission.  She will need outpatient GYN follow-up for further evaluation.  I think the patient will benefit from a course of Provera.  First dose in the ER  Final Clinical Impressions(s) / ED Diagnoses   Final diagnoses:  Symptomatic anemia  Menorrhagia with irregular cycle    New Prescriptions New Prescriptions   No medications on file     Jola Schmidt, MD 03/20/17 1905

## 2017-03-20 NOTE — ED Notes (Signed)
Being sent by PCP-states vaginal bleeding for a month-HgB 3.7

## 2017-03-21 ENCOUNTER — Encounter (HOSPITAL_COMMUNITY): Payer: Self-pay | Admitting: *Deleted

## 2017-03-21 DIAGNOSIS — D649 Anemia, unspecified: Secondary | ICD-10-CM | POA: Diagnosis not present

## 2017-03-21 LAB — CBC
HCT: 28.8 % — ABNORMAL LOW (ref 36.0–46.0)
HEMOGLOBIN: 9.3 g/dL — AB (ref 12.0–15.0)
MCH: 23.8 pg — AB (ref 26.0–34.0)
MCHC: 32.3 g/dL (ref 30.0–36.0)
MCV: 73.8 fL — ABNORMAL LOW (ref 78.0–100.0)
PLATELETS: 152 10*3/uL (ref 150–400)
RBC: 3.9 MIL/uL (ref 3.87–5.11)
RDW: 21 % — AB (ref 11.5–15.5)
WBC: 8.2 10*3/uL (ref 4.0–10.5)

## 2017-03-21 LAB — ABO/RH: ABO/RH(D): O POS

## 2017-03-21 MED ORDER — SODIUM CHLORIDE 0.9 % IV SOLN
510.0000 mg | Freq: Once | INTRAVENOUS | Status: AC
  Administered 2017-03-21: 510 mg via INTRAVENOUS
  Filled 2017-03-21: qty 17

## 2017-03-21 MED ORDER — FUROSEMIDE 10 MG/ML IJ SOLN: 20.0000 mg | Freq: Once | INTRAMUSCULAR | Status: DC

## 2017-03-21 MED ORDER — FERROUS SULFATE 325 (65 FE) MG PO TABS: 325.0000 mg | ORAL_TABLET | Freq: Three times a day (TID) | ORAL | 0 refills | Status: AC

## 2017-03-21 NOTE — Discharge Instructions (Signed)
Follow with Primary MD in 7 days   Get CBC, CMP, Anemia/Iron Panel checked  by Primary MD or SNF MD in 5-7 days ( we routinely change or add medications that can affect your baseline labs and fluid status, therefore we recommend that you get the mentioned basic workup next visit with your PCP, your PCP may decide not to get them or add new tests based on their clinical decision)  Activity: As tolerated with Full fall precautions use walker/cane & assistance as needed  Disposition Home   Diet:  Heart Healthy    For Heart failure patients - Check your Weight same time everyday, if you gain over 2 pounds, or you develop in leg swelling, experience more shortness of breath or chest pain, call your Primary MD immediately. Follow Cardiac Low Salt Diet and 1.5 lit/day fluid restriction.  On your next visit with your primary care physician please Get Medicines reviewed and adjusted.  Please request your Prim.MD to go over all Hospital Tests and Procedure/Radiological results at the follow up, please get all Hospital records sent to your Prim MD by signing hospital release before you go home.  If you experience worsening of your admission symptoms, develop shortness of breath, life threatening emergency, suicidal or homicidal thoughts you must seek medical attention immediately by calling 911 or calling your MD immediately  if symptoms less severe.  You Must read complete instructions/literature along with all the possible adverse reactions/side effects for all the Medicines you take and that have been prescribed to you. Take any new Medicines after you have completely understood and accpet all the possible adverse reactions/side effects.   Do not drive, operate heavy machinery, perform activities at heights, swimming or participation in water activities or provide baby sitting services if your were admitted for syncope or siezures until you have seen by Primary MD or a Neurologist and advised to do so  again.  Do not drive when taking Pain medications.    Do not take more than prescribed Pain, Sleep and Anxiety Medications  Special Instructions: If you have smoked or chewed Tobacco  in the last 2 yrs please stop smoking, stop any regular Alcohol  and or any Recreational drug use.  Wear Seat belts while driving.   Please note  You were cared for by a hospitalist during your hospital stay. If you have any questions about your discharge medications or the care you received while you were in the hospital after you are discharged, you can call the unit and asked to speak with the hospitalist on call if the hospitalist that took care of you is not available. Once you are discharged, your primary care physician will handle any further medical issues. Please note that NO REFILLS for any discharge medications will be authorized once you are discharged, as it is imperative that you return to your primary care physician (or establish a relationship with a primary care physician if you do not have one) for your aftercare needs so that they can reassess your need for medications and monitor your lab values.

## 2017-03-21 NOTE — Progress Notes (Signed)
Nurse reviewed discharge instructions with pt.  Pt verbalized understanding of discharge instructions, follow up appointments and new medication.  Prescription given to pt prior to discharge.   

## 2017-03-21 NOTE — Discharge Summary (Signed)
[name] KXF:[id number] DOB: 1991-06-11 DOA: 03/20/2017  PCP: System, Provider Not In  Admit date: 03/20/2017  Discharge date: 03/21/2017  Admitted From: Home  Disposition:  Home   Recommendations for Outpatient Follow-up:   Follow up with PCP in 1-2 weeks  PCP Please obtain BMP/CBC, 2 view CXR in 1week,  (see Discharge instructions)   PCP Please follow up on the following pending results: Monitor anemia panel and H&H needs outpatient OB follow-up within 1-2 weeks   Home Health: None   Equipment/Devices: None  Consultations: None Discharge Condition: Stable   CODE STATUS: Full   Diet Recommendation:  Heart Healthy    Chief Complaint  Patient presents with  . Abnormal Lab  . Tachycardia     Brief history of present illness from the day of admission and additional interim summary    [name] is a 26 y.o. female with medical history significant of asthma was coming to the emergency department referred by her PCP due to having a hemoglobin level of 3.7 g/dL. The patient states that she has a history of heavy menstrual periods for over a year. She went once to see GYN last year, but did not follow up with them due to therapeutic approach disagreement.                                                                 Hospital Course      Symptomatic microcytic anemia most likely iron deficiency due to heavy menorrhagia and blood loss during her menstrual periods. She received 4 units of packed RBC transfusion with stable posttransfusion H&H, no symptoms of digestive of GI blood loss, will be discharged on NSAIDs and iron supplement with outpatient OB follow-up within 1-2 weeks. Request PCP to monitor H&H and anemia panel closely.  Smoking and asthma disorder. Counseled to quit smoking, no acute issues  with asthma.  Discharge diagnosis     Principal Problem:   Symptomatic anemia Active Problems:   Asthma   Tobacco use disorder    Discharge instructions    Discharge Instructions    Diet - low sodium heart healthy    Complete by:  As directed    Discharge instructions    Complete by:  As directed    Follow with Primary MD in 7 days   Get CBC, CMP, Anemia/Iron Panel checked  by Primary MD or SNF MD in 5-7 days ( we routinely change or add medications that can affect your baseline labs and fluid status, therefore we recommend that you get the mentioned basic workup next visit with your PCP, your PCP may decide not to get them or add new tests based on their clinical decision)  Activity: As tolerated with Full fall precautions use walker/cane & assistance as needed  Disposition Home  Diet:  Heart Healthy    For Heart failure patients - Check your Weight same time everyday, if you gain over 2 pounds, or you develop in leg swelling, experience more shortness of breath or chest pain, call your Primary MD immediately. Follow Cardiac Low Salt Diet and 1.5 lit/day fluid restriction.  On your next visit with your primary care physician please Get Medicines reviewed and adjusted.  Please request your Prim.MD to go over all Hospital Tests and Procedure/Radiological results at the follow up, please get all Hospital records sent to your Prim MD by signing hospital release before you go home.  If you experience worsening of your admission symptoms, develop shortness of breath, life threatening emergency, suicidal or homicidal thoughts you must seek medical attention immediately by calling 911 or calling your MD immediately  if symptoms less severe.  You Must read complete instructions/literature along with all the possible adverse reactions/side effects for all the Medicines you take and that have been prescribed to you. Take any new Medicines after you have completely understood and accpet  all the possible adverse reactions/side effects.   Do not drive, operate heavy machinery, perform activities at heights, swimming or participation in water activities or provide baby sitting services if your were admitted for syncope or siezures until you have seen by Primary MD or a Neurologist and advised to do so again.  Do not drive when taking Pain medications.    Do not take more than prescribed Pain, Sleep and Anxiety Medications  Special Instructions: If you have smoked or chewed Tobacco  in the last 2 yrs please stop smoking, stop any regular Alcohol  and or any Recreational drug use.  Wear Seat belts while driving.   Please note  You were cared for by a hospitalist during your hospital stay. If you have any questions about your discharge medications or the care you received while you were in the hospital after you are discharged, you can call the unit and asked to speak with the hospitalist on call if the hospitalist that took care of you is not available. Once you are discharged, your primary care physician will handle any further medical issues. Please note that NO REFILLS for any discharge medications will be authorized once you are discharged, as it is imperative that you return to your primary care physician (or establish a relationship with a primary care physician if you do not have one) for your aftercare needs so that they can reassess your need for medications and monitor your lab values.   Increase activity slowly    Complete by:  As directed       Discharge Medications   Allergies as of 03/21/2017   No Known Allergies     Medication List    TAKE these medications   ferrous sulfate 325 (65 FE) MG tablet Take 1 tablet (325 mg total) by mouth 3 (three) times daily with meals.   naproxen 500 MG tablet Commonly known as:  NAPROSYN Take 500 mg by mouth 2 (two) times daily as needed for mild pain or moderate pain.   permethrin 5 % cream Commonly known as:   ELIMITE Apply to entire body other than face - let sit for 14 hours then wash off, may repeat in 1 week if still having symptoms       Follow-up [place]. Schedule an appointment as soon as possible for a visit in 1 week(s).   Contact information:  [address] [place] [address] [phone]       Chancy Milroy, MD. Schedule an appointment as soon as possible for a visit in 1 week(s).   Specialty:  Obstetrics and Gynecology Why:  Menorrhagia Contact information: [address] [place] [address] [phone]           Major procedures and Radiology Reports - PLEASE review detailed and final reports thoroughly  -         No results found.  Micro Results     No results found for this or any previous visit (from the past 240 hour(s)).  Today   Subjective    [name] today has no headache,no chest abdominal pain,no new weakness tingling or numbness, feels much better wants to go home today.     Objective   Blood pressure (!) 104/55, pulse 74, temperature 99.3 F (37.4 C), temperature source Oral, resp. rate 17, height 5\' 3"  (1.6 m), weight 78.2 kg (172 lb 6 oz), SpO2 100 %.   Intake/Output Summary (Last 24 hours) at 03/21/17 1150 Last data filed at 03/21/17 1000  Gross per 24 hour  Intake             3360 ml  Output              900 ml  Net             2460 ml    Exam Awake Alert, Oriented x 3, No new F.N deficits, Normal affect [place].AT,PERRAL Supple Neck,No JVD, No cervical lymphadenopathy appriciated.  Symmetrical Chest wall movement, Good air movement bilaterally, CTAB RRR,No Gallops,Rubs or new Murmurs, No Parasternal Heave +ve B.Sounds, Abd Soft, Non tender, No organomegaly appriciated, No rebound -guarding or rigidity. No Cyanosis, Clubbing or edema, No new Rash or bruise   Data Review   CBC w Diff: Lab Results  Component Value Date   WBC 8.2 03/21/2017    HGB 9.3 (L) 03/21/2017   HCT 28.8 (L) 03/21/2017   PLT 152 03/21/2017   LYMPHOPCT 32 04/28/2007   MONOPCT 7 04/28/2007   EOSPCT 5 04/28/2007   BASOPCT 2 (H) 04/28/2007    CMP: Lab Results  Component Value Date   NA 137 03/20/2017   K 3.5 03/20/2017   CL 107 03/20/2017   CO2 22 03/20/2017   BUN 8 03/20/2017   CREATININE 0.62 03/20/2017   PROT 7.0 05/20/2007   ALBUMIN 4.0 05/20/2007   BILITOT 0.5 05/20/2007   ALKPHOS 62 05/20/2007   AST 20 05/20/2007   ALT 17 05/20/2007  .   Total Time in preparing paper work, data evaluation and todays exam - 46 minutes  Lala Lund M.D on 03/21/2017 at 11:50 AM  [facility]   Office  [phone]

## 2017-03-22 LAB — HIV ANTIBODY (ROUTINE TESTING W REFLEX): HIV Screen 4th Generation wRfx: NONREACTIVE

## 2017-03-23 LAB — TYPE AND SCREEN
ABO/RH(D): O POS
Antibody Screen: NEGATIVE
Unit division: 0
Unit division: 0
Unit division: 0
Unit division: 0

## 2017-03-23 LAB — BPAM RBC
BLOOD PRODUCT EXPIRATION DATE: 201807192359
BLOOD PRODUCT EXPIRATION DATE: 201807192359
BLOOD PRODUCT EXPIRATION DATE: 201807192359
Blood Product Expiration Date: 201807172359
ISSUE DATE / TIME: 201806221834
ISSUE DATE / TIME: 201806230231
ISSUE DATE / TIME: 201806230552
ISSUE DATE / TIME: 201806222322
UNIT TYPE AND RH: 5100
UNIT TYPE AND RH: 5100
Unit Type and Rh: 5100
Unit Type and Rh: 5100

## 2017-03-24 ENCOUNTER — Other Ambulatory Visit: Payer: Self-pay | Admitting: Obstetrics and Gynecology

## 2017-03-24 ENCOUNTER — Other Ambulatory Visit (HOSPITAL_COMMUNITY)
Admission: RE | Admit: 2017-03-24 | Discharge: 2017-03-24 | Disposition: A | Discharge status: Home / Self Care | Payer: BLUE CROSS/BLUE SHIELD | Source: Ambulatory Visit | Attending: Obstetrics and Gynecology | Admitting: Obstetrics and Gynecology

## 2017-03-24 DIAGNOSIS — Z01419 Encounter for gynecological examination (general) (routine) without abnormal findings: Secondary | ICD-10-CM | POA: Diagnosis not present

## 2017-03-24 DIAGNOSIS — N939 Abnormal uterine and vaginal bleeding, unspecified: Secondary | ICD-10-CM

## 2017-03-26 LAB — CYTOLOGY - PAP: Diagnosis: NEGATIVE

## 2017-03-27 ENCOUNTER — Ambulatory Visit (HOSPITAL_COMMUNITY): Payer: BLUE CROSS/BLUE SHIELD

## 2017-03-27 ENCOUNTER — Encounter (HOSPITAL_COMMUNITY): Payer: Self-pay

## 2017-04-08 ENCOUNTER — Other Ambulatory Visit: Payer: Self-pay | Admitting: Obstetrics and Gynecology

## 2017-05-18 NOTE — Patient Instructions (Signed)
Your procedure is scheduled on:  Wednesday, Aug. 22, 2018  Enter through the [employer] of [facility] at:  6:00 AM  Pick up the phone at the desk and dial [phone].  Call this number if you have problems the morning of surgery: [phone].  Remember: Do NOT eat food or drink after:  Midnight Tuesday  Take these medicines the morning of surgery with a SIP OF WATER:  None  Stop ALL herbal medications at this time  Do NOT smoke the day of surgery.  Do NOT wear jewelry (body piercing), metal hair clips/bobby pins, make-up, artifical eyelashes or nail polish. Do NOT wear lotions, powders, or perfumes.  You may wear deodorant. Do NOT shave for 48 hours prior to surgery. Do NOT bring valuables to the hospital. Contacts, dentures, or bridgework may not be worn into surgery.  Have a responsible adult drive you home and stay with you for 24 hours after your procedure  Bring a copy of your healthcare power of attorney and living will documents.

## 2017-05-19 ENCOUNTER — Encounter (HOSPITAL_COMMUNITY)
Admission: RE | Admit: 2017-05-19 | Discharge: 2017-05-19 | Disposition: A | Discharge status: Home / Self Care | Payer: BLUE CROSS/BLUE SHIELD | Source: Ambulatory Visit | Attending: Obstetrics and Gynecology | Admitting: Obstetrics and Gynecology

## 2017-05-19 ENCOUNTER — Encounter (HOSPITAL_COMMUNITY): Payer: Self-pay

## 2017-05-19 DIAGNOSIS — D259 Leiomyoma of uterus, unspecified: Secondary | ICD-10-CM | POA: Diagnosis not present

## 2017-05-19 DIAGNOSIS — N939 Abnormal uterine and vaginal bleeding, unspecified: Secondary | ICD-10-CM | POA: Diagnosis present

## 2017-05-19 DIAGNOSIS — N920 Excessive and frequent menstruation with regular cycle: Secondary | ICD-10-CM | POA: Diagnosis not present

## 2017-05-19 DIAGNOSIS — D649 Anemia, unspecified: Secondary | ICD-10-CM | POA: Diagnosis not present

## 2017-05-19 DIAGNOSIS — Z87891 Personal history of nicotine dependence: Secondary | ICD-10-CM | POA: Diagnosis not present

## 2017-05-19 HISTORY — DX: Anemia, unspecified: D64.9

## 2017-05-19 HISTORY — DX: Personal history of other medical treatment: Z92.89

## 2017-05-19 LAB — CBC
HCT: 39.4 % (ref 36.0–46.0)
Hemoglobin: 13.3 g/dL (ref 12.0–15.0)
MCH: 31.1 pg (ref 26.0–34.0)
MCHC: 33.8 g/dL (ref 30.0–36.0)
MCV: 92.1 fL (ref 78.0–100.0)
PLATELETS: 248 10*3/uL (ref 150–400)
RBC: 4.28 MIL/uL (ref 3.87–5.11)
RDW: 17.1 % — ABNORMAL HIGH (ref 11.5–15.5)
WBC: 11.9 10*3/uL — ABNORMAL HIGH (ref 4.0–10.5)

## 2017-05-19 NOTE — H&P (Signed)
  History of Present Illness  General:  Pt presents for preop for Hyst/D&C, Myosure for endometrial mass. Pt has a h/o menorrhagia and anemia. Ultrasound showed EMS 2 cm and a 2.3 cm nondiscrete mass without blood flow. EMB done showed benign endometrium. Bleeding is minimal. Spotting most days. Taking Megace once daily. Has enough to last until surgery. Denies abnormal discharge. Denies Ibuprofen or ASA.   Current Medications  Taking   Vitamin C 250 MG Tablet Chewable 1 tablet Orally Once a day   Ferrous Sulfate 325 (65 Fe) MG Tablet 1 tablet Orally three times a day   Megestrol Acetate 40 MG Tablet as directed Orally Once daily, Notes: finished   Medication List reviewed and reconciled with the patient    Past Medical History  H/o substance abuse-alcohol, Marijuana.           Surgical History  adenoidectomy (still has tonsils) 1995   Family History  Father: alive, MI age 81, diagnosed with Diabetes, Hypertension  Mother: alive  Paternal Grand Father: alive  Paternal Grand Mother: alive  Maternal Grand Father: unknown  Maternal Grand Mother: deceased  denies any GYN family cancer hx.   Social History  General:  Tobacco use  cigarettes: Former smoker Tobacco history last updated 05/14/2017 EXPOSURE TO PASSIVE SMOKE: yes.  Alcohol: yes, On the weekends, beer, wine, liquor.  Recreational drug use: no.  Exercise: some running with dog in the mornings.  Marital Status: Single.  Children: none.  OCCUPATION: Psychiatrist.    Gyn History  Sexual activity currently sexually active, same sex partner.  Periods : irregular.  LMP 04/08/17, still bleeding but is much lighter.  Denies H/O Birth control.  Last pap smear date 01/2016.  Denies H/O Last mammogram date.  Denies H/O Abnormal pap smear.  Denies H/O STD.    OB History  Never been pregnant per patient.    Allergies  N.K.D.A.   Hospitalization/Major Diagnostic Procedure  Denies Past Hospitalization    Review of Systems  Denies fever/chills, chest pain, SOB, headaches, numbness/tingling. No h/o complication with anesthesia, bleeding disorders or blood clots.   Vital Signs  Wt 171, Wt change -1 lb, Ht 63, BMI 30.29, Pulse sitting 80, BP sitting 110/70.   Physical Examination  GENERAL:  Patient appears alert and oriented.  General Appearance: well-appearing, well-developed, no acute distress.  Speech: clear.  LUNGS:  Auscultation: Respiratory effort normal.  HEART:  Heart sounds: deferred.  ABDOMEN:  General: soft nontender, nondistended, no masses.  FEMALE GENITOURINARY:  Pelvic long labia bilaterally, appear dry, vaginal without abnormal discharge, no odor, scant bleeding. Uterus anteverted, nontender.Marland Kitchen  EXTREMITIES:  General: No edema or calf tenderness.     Assessments   1. Pre-operative clearance - Z01.818 (Primary)   2. Endometrial mass - N94.89   3. Abnormal uterine bleeding (AUB) - N93.9   Treatment  1. Endometrial mass  Notes: Reviewed risk, benefits of procedure hyst/D&C, removal of mass via myosure. Suction D&C possible inaddtion to hysteroscopy, myosure.    2. Abnormal uterine bleeding (AUB)  Continue Megestrol Acetate Tablet, 40 MG, as directed, Orally, Once daily, Notes: finished Notes: Continue Megace until surgery.    Follow Up  2 Weeks post op

## 2017-05-20 ENCOUNTER — Encounter (HOSPITAL_COMMUNITY)
Admission: RE | Disposition: A | Discharge status: Home / Self Care | Payer: Self-pay | Source: Ambulatory Visit | Attending: Obstetrics and Gynecology

## 2017-05-20 ENCOUNTER — Encounter (HOSPITAL_COMMUNITY): Payer: Self-pay

## 2017-05-20 ENCOUNTER — Ambulatory Visit (HOSPITAL_COMMUNITY)
Admission: RE | Admit: 2017-05-20 | Discharge: 2017-05-20 | Disposition: A | Discharge status: Home / Self Care | Payer: BLUE CROSS/BLUE SHIELD | Source: Ambulatory Visit | Attending: Obstetrics and Gynecology | Admitting: Obstetrics and Gynecology

## 2017-05-20 ENCOUNTER — Ambulatory Visit (HOSPITAL_COMMUNITY): Payer: BLUE CROSS/BLUE SHIELD | Admitting: Certified Registered Nurse Anesthetist

## 2017-05-20 DIAGNOSIS — Z87891 Personal history of nicotine dependence: Secondary | ICD-10-CM | POA: Insufficient documentation

## 2017-05-20 DIAGNOSIS — D649 Anemia, unspecified: Secondary | ICD-10-CM | POA: Insufficient documentation

## 2017-05-20 DIAGNOSIS — N920 Excessive and frequent menstruation with regular cycle: Secondary | ICD-10-CM | POA: Insufficient documentation

## 2017-05-20 DIAGNOSIS — N939 Abnormal uterine and vaginal bleeding, unspecified: Secondary | ICD-10-CM | POA: Insufficient documentation

## 2017-05-20 DIAGNOSIS — D259 Leiomyoma of uterus, unspecified: Secondary | ICD-10-CM | POA: Diagnosis not present

## 2017-05-20 HISTORY — DX: Other specified postprocedural states: R11.2

## 2017-05-20 HISTORY — PX: DILATATION & CURETTAGE/HYSTEROSCOPY WITH MYOSURE: SHX6511

## 2017-05-20 HISTORY — DX: Other specified postprocedural states: Z98.890

## 2017-05-20 LAB — PREGNANCY, URINE: Preg Test, Ur: NEGATIVE

## 2017-05-20 SURGERY — DILATATION & CURETTAGE/HYSTEROSCOPY WITH MYOSURE
Anesthesia: General | Site: Vagina

## 2017-05-20 MED ORDER — ONDANSETRON HCL 4 MG/2ML IJ SOLN
INTRAMUSCULAR | Status: AC
  Filled 2017-05-20: qty 2

## 2017-05-20 MED ORDER — SODIUM CHLORIDE 0.9 % IR SOLN
Status: DC | PRN
  Administered 2017-05-20: 12000 mL

## 2017-05-20 MED ORDER — KETOROLAC TROMETHAMINE 30 MG/ML IJ SOLN: 30.0000 mg | Freq: Once | INTRAMUSCULAR | Status: DC | PRN

## 2017-05-20 MED ORDER — OXYCODONE HCL 5 MG PO TABS: 5.0000 mg | ORAL_TABLET | Freq: Once | ORAL | Status: DC | PRN

## 2017-05-20 MED ORDER — ACETAMINOPHEN 160 MG/5ML PO SOLN: 325.0000 mg | ORAL | Status: DC | PRN

## 2017-05-20 MED ORDER — DEXAMETHASONE SODIUM PHOSPHATE 4 MG/ML IJ SOLN
INTRAMUSCULAR | Status: AC
  Filled 2017-05-20: qty 1

## 2017-05-20 MED ORDER — LIDOCAINE HCL 2 % IJ SOLN
INTRAMUSCULAR | Status: DC | PRN
  Administered 2017-05-20: 10 mL

## 2017-05-20 MED ORDER — ONDANSETRON HCL 4 MG/2ML IJ SOLN: 4.0000 mg | Freq: Once | INTRAMUSCULAR | Status: DC | PRN

## 2017-05-20 MED ORDER — FENTANYL CITRATE (PF) 100 MCG/2ML IJ SOLN
INTRAMUSCULAR | Status: AC
  Filled 2017-05-20: qty 2

## 2017-05-20 MED ORDER — FENTANYL CITRATE (PF) 100 MCG/2ML IJ SOLN
INTRAMUSCULAR | Status: DC | PRN
  Administered 2017-05-20 (×2): 50 ug via INTRAVENOUS

## 2017-05-20 MED ORDER — SCOPOLAMINE 1 MG/3DAYS TD PT72
1.0000 | MEDICATED_PATCH | Freq: Once | TRANSDERMAL | Status: DC
  Administered 2017-05-20: 1.5 mg via TRANSDERMAL

## 2017-05-20 MED ORDER — MIDAZOLAM HCL 2 MG/2ML IJ SOLN
INTRAMUSCULAR | Status: DC | PRN
  Administered 2017-05-20: 2 mg via INTRAVENOUS

## 2017-05-20 MED ORDER — LIDOCAINE HCL (CARDIAC) 20 MG/ML IV SOLN
INTRAVENOUS | Status: DC | PRN
  Administered 2017-05-20: 50 mg via INTRAVENOUS

## 2017-05-20 MED ORDER — DEXAMETHASONE SODIUM PHOSPHATE 10 MG/ML IJ SOLN
INTRAMUSCULAR | Status: DC | PRN
  Administered 2017-05-20: 4 mg via INTRAVENOUS

## 2017-05-20 MED ORDER — MIDAZOLAM HCL 2 MG/2ML IJ SOLN
INTRAMUSCULAR | Status: AC
  Filled 2017-05-20: qty 2

## 2017-05-20 MED ORDER — MEPERIDINE HCL 25 MG/ML IJ SOLN: 6.2500 mg | INTRAMUSCULAR | Status: DC | PRN

## 2017-05-20 MED ORDER — PROPOFOL 10 MG/ML IV BOLUS
INTRAVENOUS | Status: DC | PRN
  Administered 2017-05-20: 200 mg via INTRAVENOUS

## 2017-05-20 MED ORDER — ONDANSETRON HCL 4 MG/2ML IJ SOLN
INTRAMUSCULAR | Status: DC | PRN
  Administered 2017-05-20: 4 mg via INTRAVENOUS

## 2017-05-20 MED ORDER — OXYCODONE HCL 5 MG/5ML PO SOLN: 5.0000 mg | Freq: Once | ORAL | Status: DC | PRN

## 2017-05-20 MED ORDER — ACETAMINOPHEN 325 MG PO TABS: 325.0000 mg | ORAL_TABLET | ORAL | Status: DC | PRN

## 2017-05-20 MED ORDER — PROPOFOL 10 MG/ML IV BOLUS
INTRAVENOUS | Status: AC
  Filled 2017-05-20: qty 20

## 2017-05-20 MED ORDER — FENTANYL CITRATE (PF) 100 MCG/2ML IJ SOLN
25.0000 ug | INTRAMUSCULAR | Status: DC | PRN
  Administered 2017-05-20 (×2): 50 ug via INTRAVENOUS

## 2017-05-20 MED ORDER — METHYLERGONOVINE MALEATE 0.2 MG/ML IJ SOLN
INTRAMUSCULAR | Status: DC | PRN
  Administered 2017-05-20: 0.2 mg via INTRAMUSCULAR

## 2017-05-20 MED ORDER — OXYCODONE-ACETAMINOPHEN 5-325 MG PO TABS: 1.0000 | ORAL_TABLET | ORAL | 0 refills | Status: AC | PRN

## 2017-05-20 MED ORDER — FENTANYL CITRATE (PF) 100 MCG/2ML IJ SOLN
INTRAMUSCULAR | Status: AC
  Administered 2017-05-20: 50 ug via INTRAVENOUS
  Filled 2017-05-20: qty 2

## 2017-05-20 MED ORDER — LACTATED RINGERS IV SOLN
INTRAVENOUS | Status: DC
  Administered 2017-05-20 (×2): via INTRAVENOUS

## 2017-05-20 MED ORDER — KETOROLAC TROMETHAMINE 30 MG/ML IJ SOLN
INTRAMUSCULAR | Status: AC
  Filled 2017-05-20: qty 1

## 2017-05-20 MED ORDER — SCOPOLAMINE 1 MG/3DAYS TD PT72
MEDICATED_PATCH | TRANSDERMAL | Status: AC
  Administered 2017-05-20: 1.5 mg via TRANSDERMAL
  Filled 2017-05-20: qty 1

## 2017-05-20 MED ORDER — IBUPROFEN 800 MG PO TABS: 800.0000 mg | ORAL_TABLET | Freq: Three times a day (TID) | ORAL | 0 refills | Status: AC | PRN

## 2017-05-20 SURGICAL SUPPLY — 20 items
CANISTER SUCT 3000ML PPV (MISCELLANEOUS) ×3 IMPLANT
CATH ROBINSON RED A/P 16FR (CATHETERS) ×3 IMPLANT
CLOTH BEACON ORANGE TIMEOUT ST (SAFETY) ×3 IMPLANT
CONTAINER PREFILL 10% NBF 60ML (FORM) ×6 IMPLANT
DEVICE MYOSURE LITE (MISCELLANEOUS) IMPLANT
DEVICE MYOSURE REACH (MISCELLANEOUS) ×2 IMPLANT
DILATOR CANAL MILEX (MISCELLANEOUS) ×2 IMPLANT
FILTER ARTHROSCOPY CONVERTOR (FILTER) ×3 IMPLANT
GLOVE BIO SURGEON STRL SZ7 (GLOVE) ×3 IMPLANT
GLOVE BIOGEL PI IND STRL 7.0 (GLOVE) ×2 IMPLANT
GLOVE BIOGEL PI INDICATOR 7.0 (GLOVE) ×4
GOWN STRL REUS W/TWL LRG LVL3 (GOWN DISPOSABLE) ×6 IMPLANT
MYOSURE XL FIBROID REM (MISCELLANEOUS) ×3
PACK VAGINAL MINOR WOMEN LF (CUSTOM PROCEDURE TRAY) ×3 IMPLANT
PAD OB MATERNITY 4.3X12.25 (PERSONAL CARE ITEMS) ×3 IMPLANT
SEAL ROD LENS SCOPE MYOSURE (ABLATOR) ×3 IMPLANT
SYSTEM TISS REMOVAL MYSR XL RM (MISCELLANEOUS) IMPLANT
TOWEL OR 17X24 6PK STRL BLUE (TOWEL DISPOSABLE) ×6 IMPLANT
TUBING AQUILEX INFLOW (TUBING) ×3 IMPLANT
TUBING AQUILEX OUTFLOW (TUBING) ×3 IMPLANT

## 2017-05-20 NOTE — Transfer of Care (Signed)
Immediate Anesthesia Transfer of Care Note  Patient: [name]  Procedure(s) Performed: Procedure(s) with comments: DILATATION & CURETTAGE/HYSTEROSCOPY WITH MYOSURE (N/A) - Endometrial Mass Myosure rep will be here confirmed on 04/27/17  Patient Location: PACU  Anesthesia Type:General  Level of Consciousness: awake, alert  and oriented  Airway & Oxygen Therapy: Patient Spontanous Breathing and Patient connected to nasal cannula oxygen  Post-op Assessment: Report given to RN and Post -op Vital signs reviewed and stable  Post vital signs: Reviewed and stable  Last Vitals:  Vitals:   05/20/17 0608  BP: 118/80  Pulse: 82  Resp: 16  Temp: 36.9 C  SpO2: 100%    Last Pain:  Vitals:   05/20/17 0608  TempSrc: Oral  PainSc: 2       Patients Stated Pain Goal: 6 (35/57/32 2025)  Complications: No apparent anesthesia complications

## 2017-05-20 NOTE — Anesthesia Postprocedure Evaluation (Signed)
Anesthesia Post Note  Patient: [name]  Procedure(s) Performed: Procedure(s) (LRB): DILATATION & CURETTAGE/HYSTEROSCOPY WITH MYOSURE, Myomectomy (N/A)     Patient location during evaluation: PACU Anesthesia Type: General Level of consciousness: awake Pain management: pain level controlled Vital Signs Assessment: post-procedure vital signs reviewed and stable Respiratory status: spontaneous breathing Cardiovascular status: stable Postop Assessment: no signs of nausea or vomiting Anesthetic complications: no    Last Vitals:  Vitals:   05/20/17 0900 05/20/17 0910  BP: 118/81   Pulse: 95 92  Resp: 16 18  Temp:    SpO2: 95% 98%    Last Pain:  Vitals:   05/20/17 0915  TempSrc:   PainSc: 3    Pain Goal: Patients Stated Pain Goal: 6 (05/20/17 7867)               Deette Revak JR,JOHN Mateo Flow

## 2017-05-20 NOTE — Discharge Instructions (Addendum)
DISCHARGE INSTRUCTIONS: D&C/HYSTEROSCOPY / Myosure ablation  The following instructions have been prepared to help you care for yourself upon your return home.  May Remove Scop patch on or before Saturday(wash hands well after throwing patch away)   May take stool softner while taking narcotic pain medication to prevent constipation.  Drink plenty of water.  Personal hygiene:  Use sanitary pads for vaginal drainage, not tampons.  Shower the day after your procedure.  NO tub baths, pools or Jacuzzis for 2-3 weeks.  Wipe front to back after using the bathroom.  Activity and limitations:  Do NOT drive or operate any equipment for 24 hours. The effects of anesthesia are still present and drowsiness may result.  Do NOT rest in bed all day.  Walking is encouraged.  Walk up and down stairs slowly.  You may resume your normal activity in one to two days or as indicated by your physician. **No lifting anything over 10 lbs for 2 weeks. Sexual activity: NO intercourse for at least 2 weeks after the procedure, or as indicated by your Doctor.  Diet: Eat a light meal as desired this evening. You may resume your usual diet tomorrow.  Return to Work: You may resume your work activities in one to two days or as indicated by Marine scientist.  What to expect after your surgery: Expect to have vaginal bleeding/discharge for 2-3 days and spotting for up to 10 days. It is not unusual to have soreness for up to 1-2 weeks. You may have a slight burning sensation when you urinate for the first day. Mild cramps may continue for a couple of days. You may have a regular period in 2-6 weeks.  Call your doctor for any of the following:  Excessive vaginal bleeding or clotting, saturating and changing one pad every hour.  Inability to urinate 6 hours after discharge from hospital.  Pain not relieved by pain medication.  Fever of 100.4 F or greater.  Unusual vaginal discharge or  odor.   Patients signature: ______________________  Nurses signature ________________________  Support person's signature___________________________

## 2017-05-20 NOTE — Brief Op Note (Signed)
05/20/2017  8:46 AM  PATIENT:  [name]  26 y.o. female  PRE-OPERATIVE DIAGNOSIS:  N93.9 AUB N94.89 Endometrial Mass  POST-OPERATIVE DIAGNOSIS:  N93.9 AUB, Fibroids  PROCEDURE:  Procedure(s) with comments: DILATATION & CURETTAGE/HYSTEROSCOPY WITH MYOSURE (N/A) - Endometrial Mass Myosure rep will be here confirmed on 04/27/17  Myomectomy  SURGEON:  Surgeon(s) and Role:    Thurnell Lose, MD - Primary  PHYSICIAN ASSISTANT:   ASSISTANTS: none   ANESTHESIA:   general  EBL:  Total I/O In: 1000 [I.V.:1000] Out: 66 [Urine:25; Blood:25] Deficit 2300  BLOOD ADMINISTERED:none  DRAINS: none   LOCAL MEDICATIONS USED:  LIDOCAINE  and Amount: 10 ml  SPECIMEN:  Source of Specimen:  Fibroids (in sock), endometrial currettings  DISPOSITION OF SPECIMEN:  PATHOLOGY  COUNTS:  YES  TOURNIQUET:  * No tourniquets in log *  DICTATION: .Other Dictation: Dictation Number [id number]  PLAN OF CARE: Discharge to home after PACU  PATIENT DISPOSITION:  PACU - hemodynamically stable.   Delay start of Pharmacological VTE agent (>24hrs) due to surgical blood loss or risk of bleeding: yes

## 2017-05-20 NOTE — Anesthesia Preprocedure Evaluation (Addendum)
Anesthesia Evaluation  Patient identified by MRN, date of birth, ID band Patient awake    Reviewed: Allergy & Precautions, NPO status , Patient's Chart, lab work & pertinent test results  History of Anesthesia Complications (+) PONV  Airway Mallampati: II       Dental no notable dental hx. (+) Teeth Intact   Pulmonary Current Smoker,    Pulmonary exam normal        Cardiovascular Normal cardiovascular exam Rhythm:Regular Rate:Normal     Neuro/Psych negative neurological ROS  negative psych ROS   GI/Hepatic negative GI ROS, Neg liver ROS,   Endo/Other  negative endocrine ROS  Renal/GU negative Renal ROS  negative genitourinary   Musculoskeletal negative musculoskeletal ROS (+)   Abdominal (+) + obese,   Peds  Hematology   Anesthesia Other Findings   Reproductive/Obstetrics negative OB ROS                            Anesthesia Physical Anesthesia Plan  ASA: II  Anesthesia Plan: General   Post-op Pain Management:    Induction: Intravenous  PONV Risk Score and Plan: 4 or greater and Ondansetron, Dexamethasone, Midazolam, Scopolamine patch - Pre-op and Propofol infusion  Airway Management Planned: LMA  Additional Equipment:   Intra-op Plan:   Post-operative Plan:   Informed Consent: I have reviewed the patients History and Physical, chart, labs and discussed the procedure including the risks, benefits and alternatives for the proposed anesthesia with the patient or authorized representative who has indicated his/her understanding and acceptance.     Plan Discussed with: CRNA and Surgeon  Anesthesia Plan Comments:         Anesthesia Quick Evaluation

## 2017-05-20 NOTE — Anesthesia Procedure Notes (Signed)
Procedure Name: LMA Insertion Date/Time: 05/20/2017 7:27 AM Performed by: Bufford Spikes Pre-anesthesia Checklist: Patient identified, Emergency Drugs available, Suction available and Patient being monitored Patient Re-evaluated:Patient Re-evaluated prior to induction Oxygen Delivery Method: Circle system utilized Preoxygenation: Pre-oxygenation with 100% oxygen Induction Type: IV induction Ventilation: Mask ventilation without difficulty LMA: LMA inserted LMA Size: 4.0 Number of attempts: 1 Airway Equipment and Method: Bite block Placement Confirmation: positive ETCO2 Tube secured with: Tape Dental Injury: Teeth and Oropharynx as per pre-operative assessment

## 2017-05-20 NOTE — Interval H&P Note (Signed)
History and Physical Interval Note:  05/20/2017 7:15 AM  [name]  has presented today for surgery, with the diagnosis of N93.9 AUB N94.89 Endometrial Mass  The various methods of treatment have been discussed with the patient and family. After consideration of risks, benefits and other options for treatment, the patient has consented to  Procedure(s) with comments: DILATATION & CURETTAGE/HYSTEROSCOPY WITH MYOSURE (N/A) - Endometrial Mass Myosure rep will be here confirmed on 04/27/17 as a surgical intervention .  The patient's history has been reviewed, patient examined, no change in status, stable for surgery.  I have reviewed the patient's chart and labs.  Questions were answered to the patient's satisfaction.     Simona Huh, Chyanne Kohut

## 2017-05-21 ENCOUNTER — Encounter (HOSPITAL_COMMUNITY): Payer: Self-pay | Admitting: Obstetrics and Gynecology

## 2017-05-21 NOTE — Op Note (Signed)
[name], [name]               ACCOUNT NO.:  [id number]  MEDICAL RECORD NO.:  [id number]  LOCATION:                                 FACILITY:  PHYSICIAN:  Jola Schmidt, MD        DATE OF BIRTH:  DATE OF PROCEDURE:  05/20/2017 DATE OF DISCHARGE:                              OPERATIVE REPORT   PREOPERATIVE DIAGNOSES:  Abnormal uterine bleeding and endometrial mass.  POSTOPERATIVE DIAGNOSES:  Abnormal uterine bleeding and endometrial mass and fibroids.  PROCEDURE:  Hysteroscopy, D and C, myomectomy with MyoSure.  SURGEON:  Jola Schmidt, MD.  ASSISTANT:  None.  ANESTHESIA:  General.  ESTIMATED BLOOD LOSS:  25 mL.  FLUID DEFICIT:  2300 mL.  BLOOD ADMINISTERED:  None.  DRAINS:  None.  LOCAL:  Lidocaine 10 mL.  SPECIMEN:  Fibroids (in sock), endometrial curettings.  DISPOSITION OF SPECIMENS:  To pathology.  PATIENT DISPOSITION:  To PACU, hemodynamically stable.  COMPLICATIONS:  None.  FINDINGS:  Endometrial cavity with at least 5 fibroids normal shape after resection completed.  Fibroids vascular.  DESCRIPTION OF PROCEDURE:  The patient was taken to the operating room after being identified in the holding area.  She was placed in the dorsal lithotomy position and underwent general endotracheal anesthesia without complication.  She was then prepped and draped in a normal sterile fashion.  A time-out was performed.  The Graves speculum was inserted into the vagina after an anteverted uterus was confirmed by bimanual exam.  She was dilated very easily up to a 6 and then a 7 to allow the hysteroscope to go in.  Initially, started with an os finder.  The camera was then advanced into the cavity and had to be removed because there was a clot on the end.  Eventually, we got into the cavity and saw a large central fibroid that appeared to arise from the posterior uterus.  There were others on the right and left of the uterus and they appeared to be vascular.   The REACH blade was then used, but due to the density of the fibroid and lack of visualization due to bleeding, I decided to change to the XL.  When I initially inserted it back into the cavity, there was a leakage up with a second single-tooth tenaculum on the posterior cervix.  It was difficult to see because of the bleeding.  I was able to get very close to the fibroid, but I could not see the cavity at large without significant bleeding.  The rep was there to help troubleshoot and we did improve visualization by increasing the pressure and also improving the suction.  At all times, I was able to visualize my tip of my MyoSure.  We then removed all of the fibroid with the XL blade efficiently.  I eventually got all of the fibroids while awaiting clearance due to the bleeding.  Once the fibroids were removed, I then went and I did an endocervical curettage and there was significant bleeding noted.  The patient was given Methergine in OR.  Single-tooth tenacula were removed and pressure was held with a sponge stick and the bleeding eventually slowed even  prior to administering the Methergine.  The patient did have a paracervical block prior to the initiation of the procedure.  I used 10 mL of lidocaine and applied a single-tooth tenaculum to the anterior lip of the cervix.  All instrument and sponge counts were correct x3.  The patient was taken to the operating room in stable condition.  Of note, a time-out was performed prior to the procedure.  SCDs were on and operating throughout the entire procedure.     Jola Schmidt, MD     EBV/MEDQ  D:  05/20/2017  T:  05/20/2017  Job:  [id number]
